# Patient Record
Sex: Male | Born: 1970 | Race: White | Hispanic: No | Marital: Married | Smoking: Never smoker
Health system: Southern US, Community
[De-identification: ages and names within clinical notes are randomized; demographics above are authoritative.]

## PROBLEM LIST (undated history)

## (undated) DIAGNOSIS — J45909 Unspecified asthma, uncomplicated: Secondary | ICD-10-CM

## (undated) DIAGNOSIS — M1990 Unspecified osteoarthritis, unspecified site: Secondary | ICD-10-CM

## (undated) DIAGNOSIS — T7840XA Allergy, unspecified, initial encounter: Secondary | ICD-10-CM

## (undated) HISTORY — PX: ROTATOR CUFF REPAIR: SHX139

## (undated) HISTORY — DX: Allergy, unspecified, initial encounter: T78.40XA

## (undated) HISTORY — DX: Unspecified osteoarthritis, unspecified site: M19.90

## (undated) HISTORY — DX: Unspecified asthma, uncomplicated: J45.909

## (undated) HISTORY — PX: OTHER SURGICAL HISTORY: SHX169

---

## 2020-08-08 ENCOUNTER — Ambulatory Visit: Payer: Managed Care, Other (non HMO) | Admitting: Internal Medicine

## 2020-08-08 ENCOUNTER — Encounter: Payer: Self-pay | Admitting: Internal Medicine

## 2020-08-08 ENCOUNTER — Ambulatory Visit (INDEPENDENT_AMBULATORY_CARE_PROVIDER_SITE_OTHER): Payer: Managed Care, Other (non HMO)

## 2020-08-08 ENCOUNTER — Other Ambulatory Visit: Payer: Self-pay

## 2020-08-08 VITALS — BP 130/80 | HR 46 | Temp 98.3°F | Ht 68.0 in | Wt 164.0 lb

## 2020-08-08 DIAGNOSIS — R102 Pelvic and perineal pain: Secondary | ICD-10-CM | POA: Diagnosis not present

## 2020-08-08 DIAGNOSIS — Z0000 Encounter for general adult medical examination without abnormal findings: Secondary | ICD-10-CM

## 2020-08-08 DIAGNOSIS — Z1211 Encounter for screening for malignant neoplasm of colon: Secondary | ICD-10-CM

## 2020-08-08 LAB — COMPREHENSIVE METABOLIC PANEL
ALT: 23 U/L (ref 0–53)
AST: 20 U/L (ref 0–37)
Albumin: 4.7 g/dL (ref 3.5–5.2)
Alkaline Phosphatase: 58 U/L (ref 39–117)
BUN: 17 mg/dL (ref 6–23)
CO2: 30 mEq/L (ref 19–32)
Calcium: 10 mg/dL (ref 8.4–10.5)
Chloride: 102 mEq/L (ref 96–112)
Creatinine, Ser: 0.97 mg/dL (ref 0.40–1.50)
GFR: 90.81 mL/min (ref >60.00)
Glucose, Bld: 89 mg/dL (ref 70–99)
Potassium: 4.5 mEq/L (ref 3.5–5.1)
Sodium: 140 mEq/L (ref 135–145)
Total Bilirubin: 0.6 mg/dL (ref 0.2–1.2)
Total Protein: 7 g/dL (ref 6.0–8.3)

## 2020-08-08 LAB — LIPID PANEL
Cholesterol: 188 mg/dL (ref 0–200)
HDL: 45.2 mg/dL (ref >39.00)
LDL Cholesterol: 114 mg/dL — ABNORMAL HIGH (ref 0–99)
NonHDL: 143.12
Total CHOL/HDL Ratio: 4
Triglycerides: 147 mg/dL (ref 0.0–149.0)
VLDL: 29.4 mg/dL (ref 0.0–40.0)

## 2020-08-08 LAB — CBC
HCT: 43.7 % (ref 39.0–52.0)
Hemoglobin: 15 g/dL (ref 13.0–17.0)
MCHC: 34.2 g/dL (ref 30.0–36.0)
MCV: 86.9 fl (ref 78.0–100.0)
Platelets: 246 10*3/uL (ref 150.0–400.0)
RBC: 5.03 Mil/uL (ref 4.22–5.81)
RDW: 13.1 % (ref 11.5–15.5)
WBC: 6.2 10*3/uL (ref 4.0–10.5)

## 2020-08-08 LAB — PSA: PSA: 0.31 ng/mL (ref 0.10–4.00)

## 2020-08-08 LAB — TSH: TSH: 2.27 u[IU]/mL (ref 0.35–4.50)

## 2020-08-08 LAB — VITAMIN B12: Vitamin B-12: 265 pg/mL (ref 211–911)

## 2020-08-08 LAB — VITAMIN D 25 HYDROXY (VIT D DEFICIENCY, FRACTURES): VITD: 28.26 ng/mL — ABNORMAL LOW (ref 30.00–100.00)

## 2020-08-08 NOTE — Progress Notes (Signed)
Subjective:   I, Daniel Hobbs, LAT, ATC acting as a scribe for Lynne Leader, MD.  I'm seeing this patient as a consultation for Dr. Pricilla Holm. Note will be routed back to referring provider/PCP.  CC: Pelvis pain  HPI: Pt is a 50yo male c/o pain in pelvis that's been ongoing for since March 2021. MOI: running overuse, running 50-60 miles a week, which is is usual training volume. Pt locates pain to super deep into the public symphysis and L-side of the rectum. Pt is an ultra marathon runner. PMHx osteitis pubis 2018. Now, pt is running x2 week and biking.  LBPn: yes- L4-5 herniated disc surgery; R-sided pain LE numbness/tingling: 2-weeks ago after a 40 mile bike ride; R-sided LE weakness: no Aggravates: sleeping Rx tried: PT   Dx imaging: 08/08/20 Bilat pelvis & L-spine XR  Past medical history, Surgical history, Family history, Social history, Allergies, and medications have been entered into the medical record, reviewed.   Review of Systems: No new headache, visual changes, nausea, vomiting, diarrhea, constipation, dizziness, abdominal pain, skin rash, fevers, chills, night sweats, weight loss, swollen lymph nodes, body aches, joint swelling, muscle aches, chest pain, shortness of breath, mood changes, visual or auditory hallucinations.   Objective:    Vitals:   08/09/20 1248  BP: 90/64  Pulse: (!) 42  SpO2: 99%   General: Well Developed, well nourished, and in no acute distress.  Neuro/Psych: Alert and oriented x3, extra-ocular muscles intact, able to move all 4 extremities, sensation grossly intact. Skin: Warm and dry, no rashes noted.  Respiratory: Not using accessory muscles, speaking in full sentences, trachea midline.  Cardiovascular: Pulses palpable, no extremity edema. Abdomen: Does not appear distended. MSK: L-spine normal. Nontender midline. Tender palpation lumbar paraspinal musculature. Decreased lumbar motion. Hips bilaterally normal.  Normal hip  motion. Mildly tender palpation pubic symphysis. Not particular tender palpation at ischial tuberosity or perianal areas. Normal hip and leg motion and strength.  Lab and Radiology Results DG Lumbar Spine Complete  Result Date: 08/08/2020 CLINICAL DATA:  Chronic lower back pain. EXAM: LUMBAR SPINE - COMPLETE 4+ VIEW COMPARISON:  None. FINDINGS: No fracture or spondylolisthesis is noted. Mild degenerative disc disease is noted at L2-3 and L3-4. IMPRESSION: Mild multilevel degenerative disc disease. No acute abnormality is noted. Electronically Signed   By: Marijo Conception M.D.   On: 08/08/2020 09:51   DG HIPS BILAT WITH PELVIS 3-4 VIEWS  Result Date: 08/08/2020 CLINICAL DATA:  Chronic hip pain. EXAM: DG HIP (WITH OR WITHOUT PELVIS) 3-4V BILAT COMPARISON:  None. FINDINGS: There is no evidence of hip fracture or dislocation. There is no evidence of arthropathy or other focal bone abnormality. IMPRESSION: Negative. Electronically Signed   By: Marijo Conception M.D.   On: 08/08/2020 09:53  I, Lynne Leader, personally (independently) visualized and performed the interpretation of the images attached in this note.   Impression and Recommendations:    Assessment and Plan: 50 y.o. male with chronic pelvic pain.  Multifactorial very likely.  Patient has 2 discrete pain locations.  He has some pain near the pubic symphysis that very likely is related to his prior history of osteitis pubis.  He however also has left-sided deep pelvis pain near the perianal area.  This pain started somewhat acutely during a run months ago and has not resolved with good physical therapy trial.  Concern for past 2 or 3 sacral radiculopathy.  Also could be more discrete muscle or tendon injury.  Plan for  MRI of the pelvis.  If nondiagnostic consider MRI lumbar spine to look for radiculopathy.  Recheck after MRI.  Consider ultimately may need dedicated pelvic PT.  PDMP not reviewed this encounter. Orders Placed This Encounter   Procedures  . MR PELVIS WO CONTRAST    8 months chronic pelvis pain in pubic area and into area near rectum left side.  Eval tendon or muscle injury vs sacral radiculopathy.    Standing Status:   Future    Standing Expiration Date:   08/09/2021    Order Specific Question:   What is the patient's sedation requirement?    Answer:   No Sedation    Order Specific Question:   Does the patient have a pacemaker or implanted devices?    Answer:   No    Order Specific Question:   Preferred imaging location?    Answer:   Product/process development scientist (table limit-350lbs)   No orders of the defined types were placed in this encounter.   Discussed warning signs or symptoms. Please see discharge instructions. Patient expresses understanding.   The above documentation has been reviewed and is accurate and complete Lynne Leader, M.D.

## 2020-08-08 NOTE — Patient Instructions (Addendum)
We will check the x-ray today for the pelvis.   We will get you in for the colon cancer screening.  We will have you schedule with sports medicine to get the pain relieved in the pelvis and you back to running.    Health Maintenance, Male Adopting a healthy lifestyle and getting preventive care are important in promoting health and wellness. Ask your health care provider about:  The right schedule for you to have regular tests and exams.  Things you can do on your own to prevent diseases and keep yourself healthy. What should I know about diet, weight, and exercise? Eat a healthy diet  Eat a diet that includes plenty of vegetables, fruits, low-fat dairy products, and lean protein.  Do not eat a lot of foods that are high in solid fats, added sugars, or sodium.   Maintain a healthy weight Body mass index (BMI) is a measurement that can be used to identify possible weight problems. It estimates body fat based on height and weight. Your health care provider can help determine your BMI and help you achieve or maintain a healthy weight. Get regular exercise Get regular exercise. This is one of the most important things you can do for your health. Most adults should:  Exercise for at least 150 minutes each week. The exercise should increase your heart rate and make you sweat (moderate-intensity exercise).  Do strengthening exercises at least twice a week. This is in addition to the moderate-intensity exercise.  Spend less time sitting. Even light physical activity can be beneficial. Watch cholesterol and blood lipids Have your blood tested for lipids and cholesterol at 51 years of age, then have this test every 5 years. You may need to have your cholesterol levels checked more often if:  Your lipid or cholesterol levels are high.  You are older than 51 years of age.  You are at high risk for heart disease. What should I know about cancer screening? Many types of cancers can be  detected early and may often be prevented. Depending on your health history and family history, you may need to have cancer screening at various ages. This may include screening for:  Colorectal cancer.  Prostate cancer.  Skin cancer.  Lung cancer. What should I know about heart disease, diabetes, and high blood pressure? Blood pressure and heart disease  High blood pressure causes heart disease and increases the risk of stroke. This is more likely to develop in people who have high blood pressure readings, are of African descent, or are overweight.  Talk with your health care provider about your target blood pressure readings.  Have your blood pressure checked: ? Every 3-5 years if you are 58-65 years of age. ? Every year if you are 51 years old or older.  If you are between the ages of 33 and 62 and are a current or former smoker, ask your health care provider if you should have a one-time screening for abdominal aortic aneurysm (AAA). Diabetes Have regular diabetes screenings. This checks your fasting blood sugar level. Have the screening done:  Once every three years after age 37 if you are at a normal weight and have a low risk for diabetes.  More often and at a younger age if you are overweight or have a high risk for diabetes. What should I know about preventing infection? Hepatitis B If you have a higher risk for hepatitis B, you should be screened for this virus. Talk with your health care  provider to find out if you are at risk for hepatitis B infection. Hepatitis C Blood testing is recommended for:  Everyone born from [birth year] through 1966.  Anyone with known risk factors for hepatitis C. Sexually transmitted infections (STIs)  You should be screened each year for STIs, including gonorrhea and chlamydia, if: ? You are sexually active and are younger than 51 years of age. ? You are older than 52 years of age and your health care provider tells you that you are at risk  for this type of infection. ? Your sexual activity has changed since you were last screened, and you are at increased risk for chlamydia or gonorrhea. Ask your health care provider if you are at risk.  Ask your health care provider about whether you are at high risk for HIV. Your health care provider may recommend a prescription medicine to help prevent HIV infection. If you choose to take medicine to prevent HIV, you should first get tested for HIV. You should then be tested every 3 months for as long as you are taking the medicine. Follow these instructions at home: Lifestyle  Do not use any products that contain nicotine or tobacco, such as cigarettes, e-cigarettes, and chewing tobacco. If you need help quitting, ask your health care provider.  Do not use street drugs.  Do not share needles.  Ask your health care provider for help if you need support or information about quitting drugs. Alcohol use  Do not drink alcohol if your health care provider tells you not to drink.  If you drink alcohol: ? Limit how much you have to 0-2 drinks a day. ? Be aware of how much alcohol is in your drink. In the [place] one drink equals one 12 oz bottle of beer (355 mL), one 5 oz glass of wine (148 mL), or one 1 oz glass of hard liquor (44 mL). General instructions  Schedule regular health, dental, and eye exams.  Stay current with your vaccines.  Tell your health care provider if: ? You often feel depressed. ? You have ever been abused or do not feel safe at home. Summary  Adopting a healthy lifestyle and getting preventive care are important in promoting health and wellness.  Follow your health care provider's instructions about healthy diet, exercising, and getting tested or screened for diseases.  Follow your health care provider's instructions on monitoring your cholesterol and blood pressure. This information is not intended to replace advice given to you by your health care provider.  Make sure you discuss any questions you have with your health care provider. Document Revised: 07/08/2018 Document Reviewed: 07/08/2018 Elsevier Patient Education  2021 [employer].

## 2020-08-08 NOTE — Progress Notes (Signed)
   Subjective:   Patient ID: Daniel Hobbs, male    DOB: Jun 28, 1971, 50 y.o.   MRN: [id number]  HPI The patient is a new 50 YO man coming in for pelvis pain (going on for several months, is a long distance runner, thought it was a muscle injury and it has not healed as normal, prior low back problems, denies numbness in the low back or legs) and also overall health.   PMH, [facility], social history reviewed and updated  Review of Systems  Constitutional: Negative.   HENT: Negative.   Eyes: Negative.   Respiratory: Negative for cough, chest tightness and shortness of breath.   Cardiovascular: Negative for chest pain, palpitations and leg swelling.  Gastrointestinal: Negative for abdominal distention, abdominal pain, constipation, diarrhea, nausea and vomiting.  Musculoskeletal: Positive for arthralgias and myalgias.  Skin: Negative.   Neurological: Negative.   Psychiatric/Behavioral: Negative.     Objective:  Physical Exam Constitutional:      Appearance: He is well-developed and well-nourished.  HENT:     Head: Normocephalic and atraumatic.  Eyes:     Extraocular Movements: EOM normal.  Cardiovascular:     Rate and Rhythm: Normal rate and regular rhythm.  Pulmonary:     Effort: Pulmonary effort is normal. No respiratory distress.     Breath sounds: Normal breath sounds. No wheezing or rales.  Abdominal:     General: Bowel sounds are normal. There is no distension.     Palpations: Abdomen is soft.     Tenderness: There is no abdominal tenderness. There is no rebound.  Musculoskeletal:        General: No edema.     Cervical back: Normal range of motion.  Skin:    General: Skin is warm and dry.  Neurological:     Mental Status: He is alert and oriented to person, place, and time.     Coordination: Coordination normal.  Psychiatric:        Mood and Affect: Mood and affect normal.     Vitals:   08/08/20 0842  BP: 130/80  Pulse: (!) 46  Temp: 98.3 F (36.8 C)  TempSrc:  Oral  SpO2: 99%  Weight: 164 lb (74.4 kg)  Height: 5\' 8"  (1.727 m)    This visit occurred during the SARS-CoV-2 public health emergency.  Safety protocols were in place, including screening questions prior to the visit, additional usage of staff PPE, and extensive cleaning of exam room while observing appropriate contact time as indicated for disinfecting solutions.   Assessment & Plan:

## 2020-08-09 ENCOUNTER — Ambulatory Visit: Payer: Managed Care, Other (non HMO) | Admitting: Family Medicine

## 2020-08-09 ENCOUNTER — Other Ambulatory Visit: Payer: Self-pay

## 2020-08-09 ENCOUNTER — Encounter: Payer: Self-pay | Admitting: Family Medicine

## 2020-08-09 VITALS — BP 90/64 | HR 42 | Ht 68.0 in | Wt 167.0 lb

## 2020-08-09 DIAGNOSIS — R102 Pelvic and perineal pain: Secondary | ICD-10-CM

## 2020-08-09 DIAGNOSIS — N5082 Scrotal pain: Secondary | ICD-10-CM | POA: Diagnosis not present

## 2020-08-09 NOTE — Patient Instructions (Addendum)
Thank you for coming in today.  Plan for MRI.   Recheck following MRI.

## 2020-08-11 DIAGNOSIS — R102 Pelvic and perineal pain: Secondary | ICD-10-CM | POA: Insufficient documentation

## 2020-08-11 DIAGNOSIS — Z0000 Encounter for general adult medical examination without abnormal findings: Secondary | ICD-10-CM | POA: Insufficient documentation

## 2020-08-11 NOTE — Assessment & Plan Note (Signed)
Checking x-ray pelvis and lumbar and will schedule with sports medicine.

## 2020-08-11 NOTE — Assessment & Plan Note (Signed)
Flu shot declines. Covid-19 up to date including booster. Shingrix counseled. Tetanus declines. Colonoscopy referral to GI. Counseled about sun safety and mole surveillance. Counseled about the dangers of distracted driving. Given 10 year screening recommendations.

## 2020-08-14 ENCOUNTER — Ambulatory Visit: Payer: Self-pay | Admitting: Family Medicine

## 2020-08-28 ENCOUNTER — Telehealth: Payer: Self-pay

## 2020-08-28 NOTE — Telephone Encounter (Signed)
Patient called stating he had not heard form radiologist to schedule his MRI. I looked in the chart and it was denied since patient had not had 3 month trial of conservative therapy and a follow up. I had placed the denial notice in the basket/box up front to look over. Patient would like a call to go over what we should/need to do from here.

## 2020-08-28 NOTE — Telephone Encounter (Signed)
See pt call info.

## 2020-08-29 ENCOUNTER — Other Ambulatory Visit: Payer: Self-pay

## 2020-08-29 ENCOUNTER — Telehealth: Payer: Self-pay | Admitting: Family Medicine

## 2020-08-29 ENCOUNTER — Encounter: Payer: Self-pay | Admitting: Internal Medicine

## 2020-08-29 ENCOUNTER — Ambulatory Visit: Payer: Managed Care, Other (non HMO) | Admitting: Internal Medicine

## 2020-08-29 DIAGNOSIS — M545 Low back pain, unspecified: Secondary | ICD-10-CM

## 2020-08-29 DIAGNOSIS — R102 Pelvic and perineal pain: Secondary | ICD-10-CM

## 2020-08-29 DIAGNOSIS — M79641 Pain in right hand: Secondary | ICD-10-CM | POA: Diagnosis not present

## 2020-08-29 MED ORDER — PREDNISONE 20 MG PO TABS: 40.0000 mg | ORAL_TABLET | Freq: Every day | ORAL | 0 refills | Status: DC

## 2020-08-29 NOTE — Progress Notes (Signed)
   Subjective:   Patient ID: Daniel Hobbs, male    DOB: Apr 21, 1971, 50 y.o.   MRN: [id number]  HPI The patient is a 50 YO man coming in for concerns about right hand pain. Started out of the blue in the last several weeks. No overuse or injury. Would like to try prednisone pack for this. Is taking otc nsaids with minimal relief. Denies swelling in the hand or wrist. Pain at the base of the fingers.   Review of Systems  Constitutional: Negative.   HENT: Negative.   Eyes: Negative.   Respiratory: Negative for cough, chest tightness and shortness of breath.   Cardiovascular: Negative for chest pain, palpitations and leg swelling.  Gastrointestinal: Negative for abdominal distention, abdominal pain, constipation, diarrhea, nausea and vomiting.  Musculoskeletal: Positive for myalgias.  Skin: Negative.   Neurological: Negative.   Psychiatric/Behavioral: Negative.     Objective:  Physical Exam Constitutional:      Appearance: He is well-developed and well-nourished.  HENT:     Head: Normocephalic and atraumatic.  Eyes:     Extraocular Movements: EOM normal.  Cardiovascular:     Rate and Rhythm: Normal rate and regular rhythm.  Pulmonary:     Effort: Pulmonary effort is normal. No respiratory distress.     Breath sounds: Normal breath sounds. No wheezing or rales.  Abdominal:     General: Bowel sounds are normal. There is no distension.     Palpations: Abdomen is soft.     Tenderness: There is no abdominal tenderness. There is no rebound.  Musculoskeletal:        General: Tenderness present. No edema.     Cervical back: Normal range of motion.     Comments: Tenderness mcp joints right 2-4th fingers, no swelling or effusion detected on exam  Skin:    General: Skin is warm and dry.  Neurological:     Mental Status: He is alert and oriented to person, place, and time.     Coordination: Coordination normal.  Psychiatric:        Mood and Affect: Mood and affect normal.      Vitals:   08/29/20 1338  BP: 118/70  Pulse: (!) 55  Resp: 18  Temp: 97.9 F (36.6 C)  TempSrc: Oral  SpO2: 99%  Weight: 168 lb 9.6 oz (76.5 kg)  Height: 5\' 8"  (1.727 m)    This visit occurred during the SARS-CoV-2 public health emergency.  Safety protocols were in place, including screening questions prior to the visit, additional usage of staff PPE, and extensive cleaning of exam room while observing appropriate contact time as indicated for disinfecting solutions.   Assessment & Plan:

## 2020-08-29 NOTE — Patient Instructions (Signed)
We have sent in the prednisone to take 2 pills daily for 5 days.   
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
## 2020-08-29 NOTE — Telephone Encounter (Signed)
I called back and left a message about the MRI. I recommended waiting 6 weeks from 08/09/19

## 2020-08-30 DIAGNOSIS — M79641 Pain in right hand: Secondary | ICD-10-CM | POA: Insufficient documentation

## 2020-08-30 NOTE — Assessment & Plan Note (Signed)
Rx prednisone burst to see if this helps. If not encouraged him to bring up when returning to sports medicine.

## 2020-10-18 NOTE — Telephone Encounter (Signed)
Patient called back.  It has been the reccommended 6 weeks and he asked if the MRI could be re-ordered for him?  Please advise.

## 2020-10-20 NOTE — Telephone Encounter (Signed)
I have re run this through insurance. Awaiting reply from insurance

## 2020-10-20 NOTE — Telephone Encounter (Signed)
Please reattempt authorization of the MRI.  It has now been long enough.  MRI order should still be valid.  Please contact patient as well.

## 2020-10-25 NOTE — Telephone Encounter (Signed)
Called patient informed of approval of MRI will call and schedule with [facility] Daniel Hobbs

## 2020-10-29 ENCOUNTER — Ambulatory Visit (INDEPENDENT_AMBULATORY_CARE_PROVIDER_SITE_OTHER): Payer: Managed Care, Other (non HMO)

## 2020-10-29 ENCOUNTER — Other Ambulatory Visit: Payer: Self-pay

## 2020-10-29 DIAGNOSIS — N5082 Scrotal pain: Secondary | ICD-10-CM

## 2020-10-29 DIAGNOSIS — R1030 Lower abdominal pain, unspecified: Secondary | ICD-10-CM

## 2020-11-01 ENCOUNTER — Other Ambulatory Visit: Payer: Self-pay

## 2020-11-01 ENCOUNTER — Ambulatory Visit (AMBULATORY_SURGERY_CENTER): Payer: Self-pay

## 2020-11-01 VITALS — Ht 68.0 in | Wt 164.8 lb

## 2020-11-01 DIAGNOSIS — Z1211 Encounter for screening for malignant neoplasm of colon: Secondary | ICD-10-CM

## 2020-11-01 MED ORDER — NA SULFATE-K SULFATE-MG SULF 17.5-3.13-1.6 GM/177ML PO SOLN: 1.0000 | Freq: Once | ORAL | 0 refills | Status: AC

## 2020-11-01 NOTE — Progress Notes (Signed)
MRI pelvis shows osteitis pubis.  This probably does explain your pain.  Recommend return to clinic with me in the near future to go over the results and discuss treatment plan and options.

## 2020-11-01 NOTE — Progress Notes (Signed)
Denies allergies to eggs or soy products. Denies complication of anesthesia or sedation. Denies use of weight loss medication. Denies use of O2.   Emmi instructions given for colonoscopy.  

## 2020-11-02 NOTE — Progress Notes (Signed)
Daniel Hobbs, am serving as a Education administrator for Dr. Lynne Hobbs.  Daniel Hobbs is a 50 y.o. male who presents to [place] at [facility] today for f/u of pelvic pain and pelvis MRI review.  He was last seen by Dr. Georgina Snell on 08/09/20 w/ pain since March 2021 associated w/ long distance runing.  He is an ultra marathon runner.  Since his last visit, pt reports that he is still feeling the same.  He has not been running at all over the last few months and notes that he still has a little bit of pelvis pain but overall is improving.  Pain is located at the pubic symphysis and just inferior to the left side of the pubic symphysis and hip adductor insertion  He additionally notes some right low back pain.  He notes that he has a history of back surgery.  The physical therapy that he had for his pelvis did not address his back pain at all.  Diagnostic imaging: Pelvis MRI- 10/29/20; B hip and L-spine XR- 08/08/20  Pertinent review of systems: No fevers or chills  Relevant historical information: History of lumbar fusion   Exam:  BP 110/74 (BP Location: Left Arm, Patient Position: Sitting, Cuff Size: Normal)   Pulse (!) 40   Ht 5\' 8"  (1.727 m)   Wt 166 lb (75.3 kg)   SpO2 99%   BMI 25.24 kg/m  General: Well Developed, well nourished, and in no acute distress.   MSK: Pelvis: Tender palpation pubic symphysis    Lab and Radiology Results DG Lumbar Spine Complete  Result Date: 08/08/2020 CLINICAL DATA:  Chronic lower back pain. EXAM: LUMBAR SPINE - COMPLETE 4+ VIEW COMPARISON:  None. FINDINGS: No fracture or spondylolisthesis is noted. Mild degenerative disc disease is noted at L2-3 and L3-4. IMPRESSION: Mild multilevel degenerative disc disease. No acute abnormality is noted. Electronically Signed   By: Marijo Conception M.D.   On: 08/08/2020 09:51   MR PELVIS WO CONTRAST  Result Date: 10/31/2020 CLINICAL DATA:  Chronic pubic/groin pain EXAM: MRI PELVIS WITHOUT CONTRAST  TECHNIQUE: Multiplanar multisequence MR imaging of the pelvis was performed. No intravenous contrast was administered. COMPARISON:  Radiographs 08/08/2020 FINDINGS: Osseous structures: Edema signal in the pubic bodies and adjacent superior pubic rami with suspected subchondral bony resorption along the left pubic body on image 25 series 5 and image 25 series 6. Low-level edema in the pubic symphysis is observed. No definite fluid-filled cleft between the adductor aponeurosis and the pubis is identified although there is likely some periostitis along the abductor aponeurosis attachment to the pubis for example on images 18 through 27 of series 10. No other significant marrow edema is identified in the bony pelvis or hips. Regional joints: No hip effusion. Preserved articular cartilage in the hips. No SI joint effusion. Bursa: No regional bursitis. Musculotendinous: Intact hamstring tendons. No significant regional muscular edema. Other: Small left scrotal hydrocele. IMPRESSION: 1. Osteitis pubis. Although there is no fluid-filled gap between the pubis and the adductor aponeurosis, there is likely some low-grade periostitis in the pubis bilaterally including along the attachment to the abductor aponeurosis for example as shown on image 27 series 10 on the right and image 20 series 10 on the left. Electronically Signed   By: Van Clines M.D.   On: 10/31/2020 11:00   DG HIPS BILAT WITH PELVIS 3-4 VIEWS  Result Date: 08/08/2020 CLINICAL DATA:  Chronic hip pain. EXAM: DG HIP (WITH OR WITHOUT PELVIS) 3-4V  BILAT COMPARISON:  None. FINDINGS: There is no evidence of hip fracture or dislocation. There is no evidence of arthropathy or other focal bone abnormality. IMPRESSION: Negative. Electronically Signed   By: Marijo Conception M.D.   On: 08/08/2020 09:53   I, Daniel Hobbs, personally (independently) visualized and performed the interpretation of the images attached in this note.  Procedure: Real-time Ultrasound  Guided Injection of pubic symphysis Device: Philips Affiniti 50G Images permanently stored and available for review in PACS Verbal informed consent obtained.  Discussed risks and benefits of procedure. Warned about infection bleeding damage to structures skin hypopigmentation and fat atrophy among others. Patient expresses understanding and agreement Time-out conducted.   Noted no overlying erythema, induration, or other signs of local infection.   Skin prepped in a sterile fashion.   Local anesthesia: Topical Ethyl chloride.   With sterile technique and under real time ultrasound guidance:  40 mg of Kenalog and 2 mL of Marcaine injected into pubic symphysis. Fluid seen entering the pubic symphysis.   Completed without difficulty   Pain immediately resolved at pubic symphysis and pain inferior to pubic symphysis left side.  Low back pain did not resolve.  This suggests accurate placement of the medication and that pain generated at the pubic symphysis is responsible for both the pubic symphysis pain and inferior pain.  Advised to call if fevers/chills, erythema, induration, drainage, or persistent bleeding.   Images permanently stored and available for review in the ultrasound unit.  Impression: Technically successful ultrasound guided injection.        Assessment and Plan: 50 y.o. 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  Patient has failed conservative management with physical therapy and time.  After careful discussion with patient including discussion of risks and benefits proceeded with diagnostic and therapeutic injection of the pubic symphysis in clinic.  Patient had immediate pain relief in clinic indicating that the osteitis pubis is probably the main pain generator.  Hopefully will have good long-lasting benefit from the steroid component.  Plan to reassess in 1 month.   If the groin pain has been resolved at that point could consider focus more in the back at that time.   PDMP not reviewed this encounter. Orders Placed This Encounter  Procedures  . [place] LIMITED JOINT SPACE STRUCTURES LOW LEFT(NO LINKED CHARGES)    Standing Status:   Future    Number of Occurrences:   1    Standing Expiration Date:   11/03/2021    Order Specific Question:   Reason for Exam (SYMPTOM  OR DIAGNOSIS REQUIRED)    Answer:   Pelvic pain    Order Specific Question:   Preferred imaging location?    Answer:   Internal   No orders of the defined types were placed in this encounter.    Discussed warning signs or symptoms. Please see discharge instructions. Patient expresses understanding.   The above documentation has been reviewed and is accurate and complete Daniel Hobbs, M.D.

## 2020-11-03 ENCOUNTER — Ambulatory Visit: Payer: Self-pay

## 2020-11-03 ENCOUNTER — Ambulatory Visit: Payer: Managed Care, Other (non HMO) | Admitting: Family Medicine

## 2020-11-03 ENCOUNTER — Other Ambulatory Visit: Payer: Self-pay

## 2020-11-03 ENCOUNTER — Encounter: Payer: Self-pay | Admitting: Family Medicine

## 2020-11-03 VITALS — BP 110/74 | HR 40 | Ht 68.0 in | Wt 166.0 lb

## 2020-11-03 DIAGNOSIS — M869 Osteomyelitis, unspecified: Secondary | ICD-10-CM | POA: Diagnosis not present

## 2020-11-03 DIAGNOSIS — R102 Pelvic and perineal pain: Secondary | ICD-10-CM

## 2020-11-03 NOTE — Patient Instructions (Addendum)
Good to see you   See me again in 4 weeks.   See how it feels while numb today (4-6 hours) Try to do some things that normally would hurt while numb.   See how you feel in the next few weeks.   Call or go to the ER if you develop a large red swollen joint with extreme pain or oozing puss.

## 2020-11-13 ENCOUNTER — Encounter: Payer: Self-pay | Admitting: Gastroenterology

## 2020-11-15 ENCOUNTER — Encounter: Payer: Self-pay | Admitting: Gastroenterology

## 2020-11-15 ENCOUNTER — Other Ambulatory Visit: Payer: Self-pay

## 2020-11-15 ENCOUNTER — Ambulatory Visit (AMBULATORY_SURGERY_CENTER): Payer: Managed Care, Other (non HMO) | Admitting: Gastroenterology

## 2020-11-15 VITALS — BP 112/67 | HR 35 | Temp 97.2°F | Resp 34 | Ht 68.0 in | Wt 164.8 lb

## 2020-11-15 DIAGNOSIS — Z8371 Family history of colonic polyps: Secondary | ICD-10-CM | POA: Diagnosis not present

## 2020-11-15 DIAGNOSIS — K621 Rectal polyp: Secondary | ICD-10-CM

## 2020-11-15 DIAGNOSIS — D128 Benign neoplasm of rectum: Secondary | ICD-10-CM

## 2020-11-15 DIAGNOSIS — Z1211 Encounter for screening for malignant neoplasm of colon: Secondary | ICD-10-CM | POA: Diagnosis present

## 2020-11-15 MED ORDER — SODIUM CHLORIDE 0.9 % IV SOLN
500.0000 mL | Freq: Once | INTRAVENOUS | Status: DC
Start: 2020-11-15 — End: 2020-11-15

## 2020-11-15 NOTE — Op Note (Addendum)
[place] Patient Name: Daniel Hobbs Procedure Date: 11/15/2020 11:28 AM MRN: [id number] Endoscopist: Thornton Park MD, MD Age: 50 Referring MD:  Date of Birth: [birth year] Gender: Male Account #: [id number] Procedure:                Colonoscopy Indications:              Screening for colorectal malignant neoplasm, This                            is the patient's first colonoscopy                           Father and sister with colon polyps                           No known family history of colon cancer Medicines:                Monitored Anesthesia Care Procedure:                Pre-Anesthesia Assessment:                           - Prior to the procedure, a History and Physical                            was performed, and patient medications and                            allergies were reviewed. The patient's tolerance of                            previous anesthesia was also reviewed. The risks                            and benefits of the procedure and the sedation                            options and risks were discussed with the patient.                            All questions were answered, and informed consent                            was obtained. Prior Anticoagulants: The patient has                            taken no previous anticoagulant or antiplatelet                            agents. ASA Grade Assessment: II - A patient with                            mild systemic disease. After reviewing the risks  and benefits, the patient was deemed in                            satisfactory condition to undergo the procedure.                           After obtaining informed consent, the colonoscope                            was passed under direct vision. Throughout the                            procedure, the patient's blood pressure, pulse, and                            oxygen saturations were monitored continuously.  The                            Olympus CF-HQ190L [id number]) Colonoscope was                            introduced through the anus and advanced to the 2                            cm into the ileum. A second forward view of the                            right colon was performed. The colonoscopy was                            performed with moderate difficulty due to repeated                            clogging of the scope with residual seeds in the                            colon. The patient tolerated the procedure well.                            The quality of the bowel preparation was good. The                            terminal ileum, ileocecal valve, appendiceal                            orifice, and rectum were photographed. Scope In: 11:41:41 AM Scope Out: 11:59:36 AM Scope Withdrawal Time: 0 hours 14 minutes 39 seconds  Total Procedure Duration: 0 hours 17 minutes 55 seconds  Findings:                 The perianal and digital rectal examinations were                            normal.  Two sessile polyps were found in the rectum. The                            polyps were less than 1 mm in size. These polyps                            were removed with a cold biopsy forceps. Resection                            and retrieval were complete. Estimated blood loss                            was minimal.                           The exam was otherwise without abnormality on                            direct and retroflexion views except for internal                            hemorrhoids. Complications:            No immediate complications. Estimated blood loss:                            Minimal. Estimated Blood Loss:     Estimated blood loss was minimal. Impression:               - Two less than 1 mm polyps in the rectum, removed                            with a cold biopsy forceps. Resected and retrieved.                           - Internal  hemorrhoids.                           - The examination was otherwise normal on direct                            and retroflexion views. Recommendation:           - Patient has a contact number available for                            emergencies. The signs and symptoms of potential                            delayed complications were discussed with the                            patient. Return to normal activities tomorrow.                            Written discharge  instructions were provided to the                            patient.                           - Resume previous diet.                           - Continue present medications.                           - Await pathology results.                           - Repeat colonoscopy date to be determined after                            pending pathology results are reviewed for                            surveillance. Surveillance should not be longer                            than 5 years given the family history.                           - Plan addition prep prior to next colonoscopy to                            minimize residual seeds in the colon.                           - Emerging evidence supports eating a diet of                            fruits, vegetables, grains, calcium, and yogurt                            while reducing red meat and alcohol may reduce the                            risk of colon cancer.                           - Thank you for allowing me to be involved in your                            colon cancer prevention. Thornton Park MD, MD 11/15/2020 12:04:43 PM This report has been signed electronically.

## 2020-11-15 NOTE — Progress Notes (Signed)
A and O x3. Report to RN. Tolerated MAC anesthesia well.

## 2020-11-15 NOTE — Progress Notes (Signed)
VS by CW  I have reviewed the patient's medical history in detail and updated the computerized patient record.'  Pt HR 36. Pt states that's his baseline r/t being a runner. CRNA made aware.

## 2020-11-15 NOTE — Patient Instructions (Addendum)
HANDOUTS PROVIDED ON: POLYPS & HEMORRHOIDS  The polyps removed today have been sent for pathology.  The results can take 1-3 weeks to receive.  When your next colonoscopy should occur will be based on the pathology results.    You may resume your previous diet and medication schedule.  Thank you for allowing [place] to care for you today!!!   YOU HAD AN ENDOSCOPIC PROCEDURE TODAY AT [place]:   Refer to the procedure report that was given to you for any specific questions about what was found during the examination.  If the procedure report does not answer your questions, please call your gastroenterologist to clarify.  If you requested that your care partner not be given the details of your procedure findings, then the procedure report has been included in a sealed envelope for you to review at your convenience later.  YOU SHOULD EXPECT: Some feelings of bloating in the abdomen. Passage of more gas than usual.  Walking can help get rid of the air that was put into your GI tract during the procedure and reduce the bloating. If you had a lower endoscopy (such as a colonoscopy or flexible sigmoidoscopy) you may notice spotting of blood in your stool or on the toilet paper. If you underwent a bowel prep for your procedure, you may not have a normal bowel movement for a few days.  Please Note:  You might notice some irritation and congestion in your nose or some drainage.  This is from the oxygen used during your procedure.  There is no need for concern and it should clear up in a day or so.  SYMPTOMS TO REPORT IMMEDIATELY:   Following lower endoscopy (colonoscopy or flexible sigmoidoscopy):  Excessive amounts of blood in the stool  Significant tenderness or worsening of abdominal pains  Swelling of the abdomen that is new, acute  Fever of 100F or higher  For urgent or emergent issues, a gastroenterologist can be reached at any hour by calling [phone]. Do not use MyChart  messaging for urgent concerns.    DIET:  We do recommend a small meal at first, but then you may proceed to your regular diet.  Drink plenty of fluids but you should avoid alcoholic beverages for 24 hours.  ACTIVITY:  You should plan to take it easy for the rest of today and you should NOT DRIVE or use heavy machinery until tomorrow (because of the sedation medicines used during the test).    FOLLOW UP: Our staff will call the number listed on your records Friday morning between 7:15 am and 8:15 am following your procedure to check on you and address any questions or concerns that you may have regarding the information given to you following your procedure. If we do not reach you, we will leave a message.  We will attempt to reach you two times.  During this call, we will ask if you have developed any symptoms of COVID 19. If you develop any symptoms (ie: fever, flu-like symptoms, shortness of breath, cough etc.) before then, please call [phone].  If you test positive for Covid 19 in the 2 weeks post procedure, please call and report this information to [place].    If any biopsies were taken you will be contacted by phone or by letter within the next 1-3 weeks.  Please call us at [phone] if you have not heard about the biopsies in 3 weeks.    SIGNATURES/CONFIDENTIALITY: You and/or your care partner  have signed paperwork which will be entered into your electronic medical record.  These signatures attest to the fact that that the information above on your After Visit Summary has been reviewed and is understood.  Full responsibility of the confidentiality of this discharge information lies with you and/or your care-partner.

## 2020-11-15 NOTE — Progress Notes (Signed)
Called to room to assist during endoscopic procedure.  Patient ID and intended procedure confirmed with present staff. Received instructions for my participation in the procedure from the performing physician.  

## 2020-11-17 ENCOUNTER — Telehealth: Payer: Self-pay | Admitting: *Deleted

## 2020-11-17 NOTE — Telephone Encounter (Signed)
  Follow up Call-  Call back number 11/15/2020  Post procedure Call Back phone  # [phone]  Permission to leave phone message Yes     Patient questions:  Do you have a fever, pain , or abdominal swelling? No. Pain Score  0 *  Have you tolerated food without any problems? Yes.    Have you been able to return to your normal activities? Yes.    Do you have any questions about your discharge instructions: Diet   No. Medications  No. Follow up visit  No.  Do you have questions or concerns about your Care? No.  Actions: * If pain score is 4 or above: No action needed, pain <4.  1. Have you developed a fever since your procedure? no  2.   Have you had an respiratory symptoms (SOB or cough) since your procedure? no  3.   Have you tested positive for COVID 19 since your procedure no  4.   Have you had any family members/close contacts diagnosed with the COVID 19 since your procedure?  no   If yes to any of these questions please route to Joylene John, RN and Joella Prince, RN

## 2020-11-23 ENCOUNTER — Encounter: Payer: Self-pay | Admitting: Gastroenterology

## 2020-11-30 NOTE — Progress Notes (Signed)
   I, Wendy Poet, LAT, ATC, am serving as scribe for Dr. Lynne Leader.  Emmauel Hallums is a 50 y.o. male who presents to [place] at [facility] today for f/u of pelvic/pubic symphysis pain.  He was last seen by Dr. Georgina Snell on 11/03/20 to review his pelvic MRI and had a pubic symphysis injection.  Pt is an avid runner and competes in ultra marathons.  Since his last visit, pt reports much relief from the pubic symphysis steroid injection. Pt reports he has returned to running. Pt c/o continued low back pain. Pt contributes pain to anterior pelvic tilt. Pt has been doing back exercises and stretches at home.  Diagnostic testing: Pelvic MRI- 10/29/20; B hip and L-spine XR- 08/08/20   Pertinent review of systems: No fevers or chills  Relevant historical information: Osteitis pubis   Exam:  BP 130/80   Pulse (!) 41   Ht 5\' 8"  (1.727 m)   Wt 164 lb (74.4 kg)   SpO2 98%   BMI 24.94 kg/m  General: Well Developed, well nourished, and in no acute distress.   MSK: Spine normal. Nontender midline. Normal lumbar motion pain with flexion.  Lower extremity strength is intact  Lab and Radiology Results  EXAM: LUMBAR SPINE - COMPLETE 4+ VIEW  COMPARISON:  None.  FINDINGS: No fracture or spondylolisthesis is noted. Mild degenerative disc disease is noted at L2-3 and L3-4.  IMPRESSION: Mild multilevel degenerative disc disease. No acute abnormality is noted.   Electronically Signed   By: Marijo Conception M.D.   On: 08/08/2020 09:51 I, Lynne Leader, personally (independently) visualized and performed the interpretation of the images attached in this note.     Assessment and Plan: 50 y.o. male with low back pain.  This is a chronic issue but has been more pronounced or at least appreciable since his pelvic pain has been resolved with a steroid injection last month.  He is able to increase his running now that his pelvis is not hurting which also may be causing his  back to hurt a little worse.  Plan for physical therapy and recheck in 6 weeks.  If not improved consider MRI.   PDMP not reviewed this encounter. Orders Placed This Encounter  Procedures  . Ambulatory referral to Physical Therapy    Referral Priority:   Routine    Referral Type:   Physical Medicine    Referral Reason:   Specialty Services Required    Requested Specialty:   Physical Therapy    Number of Visits Requested:   1   No orders of the defined types were placed in this encounter.    Discussed warning signs or symptoms. Please see discharge instructions. Patient expresses understanding.   The above documentation has been reviewed and is accurate and complete Lynne Leader, M.D.

## 2020-12-01 ENCOUNTER — Other Ambulatory Visit: Payer: Self-pay

## 2020-12-01 ENCOUNTER — Ambulatory Visit: Payer: Managed Care, Other (non HMO) | Admitting: Family Medicine

## 2020-12-01 VITALS — BP 130/80 | HR 41 | Ht 68.0 in | Wt 164.0 lb

## 2020-12-01 DIAGNOSIS — G8929 Other chronic pain: Secondary | ICD-10-CM

## 2020-12-01 DIAGNOSIS — M545 Low back pain, unspecified: Secondary | ICD-10-CM

## 2020-12-01 DIAGNOSIS — M869 Osteomyelitis, unspecified: Secondary | ICD-10-CM

## 2020-12-01 NOTE — Patient Instructions (Signed)
Thank you for coming in today.  Plan a little PT.   Keep me updated.   Ok to continue running.   Recheck in 6 weeks.   Core strength will help.

## 2021-01-11 NOTE — Progress Notes (Signed)
   I, Wendy Poet, LAT, ATC, am serving as scribe for Dr. Lynne Leader.  Daniel Hobbs is a 50 y.o. male who presents to [place] at [facility] today for f/u of LBP and pelvic/purbic symphysis pain.  He was last seen by Dr. Georgina Snell on 12/01/20, noting con't LBP, and was referred to [facility].  Since his last visit, pt reports he has returned to being very active which has improved his back pain. Pt has been running about 45 miles a week. Pt notes no pain in his pelvis.  Diagnostic testing: Pelvic MRI- 10/29/20; B hip and L-spine XR- 08/08/20  Pertinent review of systems: No fevers or chills  Relevant historical information: History of osteitis pubis   Exam:  BP 100/76 (BP Location: Right Arm, Patient Position: Sitting, Cuff Size: Normal)   Pulse (!) 43   Ht 5\' 8"  (1.727 m)   Wt 158 lb 6.4 oz (71.8 kg)   SpO2 99%   BMI 24.08 kg/m  General: Well Developed, well nourished, and in no acute distress.   MSK: L-spine normal motion extension rotation lateral flexion limited flexion.    Lab and Radiology Results  EXAM: LUMBAR SPINE - COMPLETE 4+ VIEW   COMPARISON:  None.   FINDINGS: No fracture or spondylolisthesis is noted. Mild degenerative disc disease is noted at L2-3 and L3-4.   IMPRESSION: Mild multilevel degenerative disc disease. No acute abnormality is noted.     Electronically Signed   By: Marijo Conception M.D.   On: 08/08/2020 09:51  I, Lynne Leader, personally (independently) visualized and performed the interpretation of the images attached in this note.      Assessment and Plan: 50 y.o. male with low back pain significantly improved with home exercise program and some physical therapy exercise by his sister.  He has been able to resume running and is very happy with his progress.  Plan to continue home exercise and running.  If needed could progress to formal physical therapy in the future however watchful waiting for now.  Osteitis pubis  now little over 2 months since his injection.  Pain-free and able to run.  Watchful waiting.   Discussed warning signs or symptoms. Please see discharge instructions. Patient expresses understanding.   The above documentation has been reviewed and is accurate and complete Lynne Leader, M.D.

## 2021-01-12 ENCOUNTER — Ambulatory Visit: Payer: Managed Care, Other (non HMO) | Admitting: Family Medicine

## 2021-01-12 ENCOUNTER — Other Ambulatory Visit: Payer: Self-pay

## 2021-01-12 VITALS — BP 100/76 | HR 43 | Ht 68.0 in | Wt 158.4 lb

## 2021-01-12 DIAGNOSIS — G8929 Other chronic pain: Secondary | ICD-10-CM

## 2021-01-12 DIAGNOSIS — M869 Osteomyelitis, unspecified: Secondary | ICD-10-CM

## 2021-01-12 DIAGNOSIS — M545 Low back pain, unspecified: Secondary | ICD-10-CM | POA: Diagnosis not present

## 2021-01-12 NOTE — Patient Instructions (Signed)
Thank you for coming in today.   Recheck as needed.   Let me know if you would like a PT referral in the future although you are doing just fine now.   Recheck as needed for this or other issues.

## 2021-06-19 ENCOUNTER — Ambulatory Visit: Payer: Self-pay

## 2021-06-19 ENCOUNTER — Ambulatory Visit (INDEPENDENT_AMBULATORY_CARE_PROVIDER_SITE_OTHER): Payer: 59

## 2021-06-19 ENCOUNTER — Ambulatory Visit: Payer: 59 | Admitting: Family Medicine

## 2021-06-19 ENCOUNTER — Encounter: Payer: Self-pay | Admitting: Family Medicine

## 2021-06-19 ENCOUNTER — Other Ambulatory Visit: Payer: Self-pay

## 2021-06-19 VITALS — BP 120/78 | HR 43 | Ht 68.0 in | Wt 164.4 lb

## 2021-06-19 DIAGNOSIS — M7672 Peroneal tendinitis, left leg: Secondary | ICD-10-CM | POA: Diagnosis not present

## 2021-06-19 DIAGNOSIS — S93402A Sprain of unspecified ligament of left ankle, initial encounter: Secondary | ICD-10-CM

## 2021-06-19 DIAGNOSIS — S93492A Sprain of other ligament of left ankle, initial encounter: Secondary | ICD-10-CM | POA: Diagnosis not present

## 2021-06-19 DIAGNOSIS — M76822 Posterior tibial tendinitis, left leg: Secondary | ICD-10-CM

## 2021-06-19 NOTE — Progress Notes (Signed)
I, Wendy Poet, LAT, ATC, am serving as scribe for Dr. Lynne Leader.  Daniel Hobbs is a 51 y.o. male who presents to [place] at [facility] today for L ankle pain. Pt was previously seen by Dr. Georgina Snell on 01/12/21 for LBP and pubic symphysis pain. Today, pt c/o L ankle pain x 9 days when he rolled his ankle while stepping down off a curb.  Pt locates pain to his L medial and lateral malleoli and L medial calcaneus.  He has a hx of minor ankle sprains.  Prior to this injury he was running 50 miles a week.  His goal is to resume running that distance.  L ankle swelling: yes Aggravates: standing; walking; L ankle PF Treatments tried: ice; naproxen sodium; rest  Pertinent review of systems: No fevers or chills  Relevant historical information: Striae of osteitis pubis   Exam:  BP 120/78 (BP Location: Left Arm, Patient Position: Sitting, Cuff Size: Normal)   Pulse (!) 43   Ht 5\' 8"  (1.727 m)   Wt 164 lb 6.4 oz (74.6 kg)   SpO2 98%   BMI 25.00 kg/m  General: Well Developed, well nourished, and in no acute distress.   MSK: Left ankle swelling at syndesmosis area and into lateral ankle.  Otherwise normal. Range of motion limited plantarflexion and dorsiflexion otherwise normal. Tender palpation at syndesmosis and ATFL region and along tibial tunnel. Positive anterior drawer test.  Negative talar tilt test. Intact strength. Pulses capillary refill and sensation are intact distally.    Lab and Radiology Results  Diagnostic Limited MSK Ultrasound of: Left ankle Mild joint effusion medial joint line. Lateral joint line shows edema superficially to joint but no joint effusion. Bony structures otherwise normal-appearing Posterior tibialis tendon small amount of clear fluid tracks within tendon sheath and tibial tunnel area.  Tendon is intact with no visible tear present. Peroneal tendon intact. Small amount of hypoechoic fluid tracks within tendon sheath distal to  lateral malleolus. Impression: Joint effusion.  Evidence of posterior tibialis tendinitis and peroneal tendinitis.   X-ray images left ankle obtained today personally and independently interpreted Tiny avulsion fragment at medial malleolus.  No other acute abnormalities.  No large fracture visible.  No severe degenerative changes. Await formal radiology review  Assessment and Plan: 50 y.o. male with left ankle injury occurring about 9 days ago.  Clinically patient has evidence of a high ankle sprain.  Additionally has some evidence of mild tenosynovitis of the posterior tibialis tendon and peroneal tendons. Plan for physical therapy referral, body of the compression sleeve, and NSAIDs.  Discussed dosing safety.  Recheck in 1 month.  Return sooner if needed.   PDMP not reviewed this encounter. Orders Placed This Encounter  Procedures   [place] LIMITED JOINT SPACE STRUCTURES LOW LEFT(NO LINKED CHARGES)    Order Specific Question:   Reason for Exam (SYMPTOM  OR DIAGNOSIS REQUIRED)    Answer:   L ankle pain    Order Specific Question:   Preferred imaging location?    Answer:   [place]   DG Ankle Complete Left    Standing Status:   Future    Standing Expiration Date:   07/19/2021    Order Specific Question:   Reason for Exam (SYMPTOM  OR DIAGNOSIS REQUIRED)    Answer:   L ankle pain    Order Specific Question:   Preferred imaging location?    Answer:   [place]   Ambulatory referral  to Physical Therapy    Referral Priority:   Routine    Referral Type:   Physical Medicine    Referral Reason:   Specialty Services Required    Requested Specialty:   Physical Therapy    Number of Visits Requested:   1   No orders of the defined types were placed in this encounter.    Discussed warning signs or symptoms. Please see discharge instructions. Patient expresses understanding.   The above documentation has been reviewed and is accurate and complete Lynne Leader, M.D.

## 2021-06-19 NOTE — Patient Instructions (Addendum)
Good to see you today.  Please get an Xray today before you leave.  I've referred you to [facility] for PT.  Please let us know if you're not hearing from them in the next week regarding scheduling.  I recommend you obtained a compression sleeve to help with your joint problems. There are many options on the market however I recommend obtaining a ankle Body Helix compression sleeve.  You can find information (including how to appropriate measure yourself for sizing) can be found at www.Body http://www.lambert.com/.  Many of these products are health savings account (HSA) eligible.   You can use the compression sleeve at any time throughout the day but is most important to use while being active as well as for 2 hours post-activity.   It is appropriate to ice following activity with the compression sleeve in place.   Follow-up: one month

## 2021-06-20 NOTE — Progress Notes (Signed)
Left ankle x-ray does not show a fracture.

## 2021-07-16 NOTE — Progress Notes (Signed)
° °  I, Wendy Poet, LAT, ATC, am serving as scribe for Dr. Lynne Leader.  Daniel Hobbs is a 50 y.o. male who presents to [place] at [facility] today for f/u of a L high ankle sprain after rolling his ankle while stepping off of a curb while running.  He was last seen by Dr. Georgina Snell on 06/19/21 and was referred to [facility], completing 4 visits.  He was also advised to purchase an ankle compression sleeve.  Today, pt reports L ankle is getting better. Pt notes continued some pain w/ certain motions and lack of AROM. Pt reports swelling will occur after doing exercises or being on his feet a lot along the anterior-medial aspect of the L ankle. Pt has not yet returned to running.  Diagnostic testing: L ankle R- 06/19/21  Pertinent review of systems: No fevers or chills  Relevant historical information: History of osteitis pubis   Exam:  BP 110/68    Pulse (!) 41    Ht 5\' 8"  (1.727 m)    Wt 165 lb 3.2 oz (74.9 kg)    SpO2 99%    BMI 25.12 kg/m  General: Well Developed, well nourished, and in no acute distress.   MSK: Left ankle slight swelling.  Tender palpation and anterior medial ankle.   Range of motion slight limitation plantarflexion and dorsiflexion otherwise intact. Stable ligamentous exam. Intact strength.    Lab and Radiology Results EXAM: LEFT ANKLE COMPLETE - 3+ VIEW   COMPARISON:  None.   FINDINGS: Mild lateral soft tissue swelling is noted. No acute fracture or dislocation is seen.   IMPRESSION: Soft tissue swelling without acute bony abnormality.     Electronically Signed   By: Inez Catalina M.D.   On: 06/19/2021 21:48   I, Lynne Leader, personally (independently) visualized and performed the interpretation of the images attached in this note.    Assessment and Plan: 50 y.o. male with high ankle sprain with likely deltoid ligament involvement.  Ankle sprain is now about 92 month old. Daniel Hobbs has improved quite a bit with physical  therapy and a bit of time.  Plan to continue PT and advance activity as tolerated.  If not improved in the next 2 to 4 weeks significantly could consider MRI.  He will let me know.  Advance activity as tolerated and per PT.  Recheck back as needed.   Discussed warning signs or symptoms. Please see discharge instructions. Patient expresses understanding.   The above documentation has been reviewed and is accurate and complete Lynne Leader, M.D.  Total encounter time 20 minutes including face-to-face time with the patient and, reviewing past medical record, and charting on the date of service.   Treatment plan and options and precautions.

## 2021-07-19 ENCOUNTER — Ambulatory Visit: Payer: 59 | Admitting: Family Medicine

## 2021-07-19 ENCOUNTER — Other Ambulatory Visit: Payer: Self-pay

## 2021-07-19 VITALS — BP 110/68 | HR 41 | Ht 68.0 in | Wt 165.2 lb

## 2021-07-19 DIAGNOSIS — S93492D Sprain of other ligament of left ankle, subsequent encounter: Secondary | ICD-10-CM

## 2021-07-19 NOTE — Patient Instructions (Addendum)
Thank you for coming in today.   Continue physical therapy  Let me know if you aren't improving and we can order a MRI

## 2021-07-30 ENCOUNTER — Encounter: Payer: Self-pay | Admitting: Family Medicine

## 2021-07-30 DIAGNOSIS — M25572 Pain in left ankle and joints of left foot: Secondary | ICD-10-CM

## 2021-07-30 DIAGNOSIS — S93492D Sprain of other ligament of left ankle, subsequent encounter: Secondary | ICD-10-CM

## 2021-07-30 DIAGNOSIS — G8929 Other chronic pain: Secondary | ICD-10-CM

## 2021-08-05 ENCOUNTER — Other Ambulatory Visit: Payer: Self-pay

## 2021-08-05 ENCOUNTER — Ambulatory Visit (INDEPENDENT_AMBULATORY_CARE_PROVIDER_SITE_OTHER): Payer: 59

## 2021-08-05 DIAGNOSIS — G8929 Other chronic pain: Secondary | ICD-10-CM

## 2021-08-05 DIAGNOSIS — M25572 Pain in left ankle and joints of left foot: Secondary | ICD-10-CM

## 2021-08-05 DIAGNOSIS — S93492D Sprain of other ligament of left ankle, subsequent encounter: Secondary | ICD-10-CM

## 2021-08-06 ENCOUNTER — Encounter: Payer: Self-pay | Admitting: Family Medicine

## 2021-08-06 NOTE — Progress Notes (Signed)
MRI left ankle does show a partial tear of the deltoid ligament on the medial aspect of the ankle and some tendinitis.  On the lateral outside part of the ankle there is a bone bruise of some of the ankle bones and tendinitis.  Continued rehab and time should help.  Surgery should not be needed.  This is going to take longer to heal than would be typical for an ankle sprain.

## 2021-08-08 MED ORDER — NITROGLYCERIN 0.2 MG/HR TD PT24: MEDICATED_PATCH | TRANSDERMAL | 1 refills | Status: DC

## 2021-11-12 ENCOUNTER — Ambulatory Visit: Payer: 59 | Admitting: Sports Medicine

## 2021-11-12 VITALS — BP 108/70 | HR 49 | Ht 68.0 in | Wt 170.0 lb

## 2021-11-12 DIAGNOSIS — M9905 Segmental and somatic dysfunction of pelvic region: Secondary | ICD-10-CM

## 2021-11-12 DIAGNOSIS — M9904 Segmental and somatic dysfunction of sacral region: Secondary | ICD-10-CM

## 2021-11-12 DIAGNOSIS — M9903 Segmental and somatic dysfunction of lumbar region: Secondary | ICD-10-CM

## 2021-11-12 DIAGNOSIS — G8929 Other chronic pain: Secondary | ICD-10-CM

## 2021-11-12 DIAGNOSIS — M545 Low back pain, unspecified: Secondary | ICD-10-CM | POA: Diagnosis not present
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
## 2021-11-12 NOTE — Patient Instructions (Addendum)
Good to see you  ?Core and back exercises given ?Follow up in 2-3 week OMT follow up  ?

## 2021-11-28 NOTE — Progress Notes (Signed)
?   Benito Mccreedy D.Merril Abbe ?[facility] ?[place] ?Phone: [phone] ?  ?Assessment and Plan:   ?  ?1. Chronic right low back pain without sciatica ?2. Somatic dysfunction of lumbar region ?3. Somatic dysfunction of pelvic region ?4. Somatic dysfunction of sacral region ?-Chronic with exacerbation, subsequent visit ?- Continued right lower back pain, though overall improved after previous OMT treatment ?- Continue HEP for core and posterior chain strengthening program.  Continue focus on tight right hip flexors ?- Patient has received significant relief with OMT in the past.  Elects for repeat OMT today.  Tolerated well per note below. ?- Decision today to treat with OMT was based on Physical Exam ?  ?After verbal consent patient was treated with HVLA (high velocity low amplitude), ME (muscle energy), FPR (flex positional release), ST (soft tissue), PC/PD (Pelvic Compression/ Pelvic Decompression) techniques in sacrum, lumbar, and pelvic areas. Patient tolerated the procedure well with improvement in symptoms.  Patient educated on potential side effects of soreness and recommended to rest, hydrate, and use Tylenol as needed for pain control. ?  ?Pertinent previous records reviewed include none ?  ?Follow Up: 1 week to discuss acute on chronic left lower extremity pain, calf pain, ankle pain with running, ?  ?Subjective:   ?I, [name], am serving as a Education administrator for [employer] ? ?Chief Complaint: low back pain  ?  ?HPI:  ?  ?11/12/21 ?Patient is a 51 year old male complaining of low back pain. Patient states that he has bilateral low back/Si joint pain. Will take aleve if needed, but does stretches and exercises to help with the pain. ?Patient used to be a long distance runner. States that when he consistently runs he has no back pian, but when not running will have the low back/SI joint pain.  ? ?11/30/2021 ?Patient states he is good, tune up today  ?   ?Relevant Historical Information: herniated disk surgery 30years ago ? ?Additional pertinent review of systems negative. ? ?Current Outpatient Medications  ?Medication Sig Dispense Refill  ? nitroGLYCERIN (NITRODUR - DOSED IN MG/24 HR) 0.2 mg/hr patch Apply 1/4 patch daily to tendon for tendonitis. 30 patch 1  ? VITAMIN D PO Take 1 tablet by mouth daily.    ? ?No current facility-administered medications for this visit.  ?  ?  ?Objective:   ?  ?Vitals:  ? 11/30/21 1233  ?BP: 120/78  ?Pulse: (!) 47  ?SpO2: 98%  ?Weight: 167 lb (75.8 kg)  ?Height: '5\' 8"'$  (1.727 m)  ?  ?  ?Body mass index is 25.39 kg/m?.  ?  ?Physical Exam:   ?  ?General: Well-appearing, cooperative, sitting comfortably in no acute distress.  ? ?OMT Physical Exam: ? ?ASIS Compression Test: Positive Right ?Sacrum: TTP right sacral base, positive sphinx ?Lumbar: TTP paraspinal, L5 rotated left ?Pelvis: Right anterior innominate ? ?Electronically signed by:  ?Benito Mccreedy D.Merril Abbe ?[facility] ?12:52 PM 11/30/21             ?

## 2021-11-30 ENCOUNTER — Ambulatory Visit: Payer: 59 | Admitting: Sports Medicine

## 2021-11-30 VITALS — BP 120/78 | HR 47 | Ht 68.0 in | Wt 167.0 lb

## 2021-11-30 DIAGNOSIS — M9905 Segmental and somatic dysfunction of pelvic region: Secondary | ICD-10-CM | POA: Diagnosis not present

## 2021-11-30 DIAGNOSIS — M545 Low back pain, unspecified: Secondary | ICD-10-CM | POA: Diagnosis not present

## 2021-11-30 DIAGNOSIS — G8929 Other chronic pain: Secondary | ICD-10-CM | POA: Diagnosis not present

## 2021-11-30 DIAGNOSIS — M9903 Segmental and somatic dysfunction of lumbar region: Secondary | ICD-10-CM

## 2021-11-30 DIAGNOSIS — M9904 Segmental and somatic dysfunction of sacral region: Secondary | ICD-10-CM | POA: Diagnosis not present

## 2021-11-30 NOTE — Patient Instructions (Addendum)
Good to see you  ?Follow up next week to talk about ankle  ?

## 2021-12-06 NOTE — Progress Notes (Signed)
? ? Daniel Hobbs ?Daniel Hobbs ?Daniel Hobbs ?Phone: [phone] ?  ?Assessment and Plan:   ?  ?1. Chronic pain of left ankle ?2. Osteochondral lesion of talar dome ?3. Sprain of deltoid ligament of left ankle, sequela ?-Chronic with exacerbation, subsequent visit ?- Overall improvement in ankle pain since MRI in 07/2021 showed low-grade talar dome osteochondral injury, partial anterior tearing of the deltoid ligament, tenosynovitis of posterior tibial and peroneal tendons.  I believe that these injuries had moderate improvement with relative rest over the past 4 months.,  However they have led to tightening and dysfunction of ankle, gastroc, soleus, quadriceps and hamstring musculature, as well as ankle dysfunction that will occasionally cause a burning sensation in medial ankle ?- X-ray repeated at today's visit.  My interpretation: No acute fracture or dislocation.  Area of subchondral injury apparent without displacement. ?- We will focus on physical therapy and home exercise program to help regain baseline function.  Advised focusing on ankle, but including calf and thigh musculature as well ? ?  ?Pertinent previous records reviewed include ankle x-ray 06/19/2021, ankle MRI 08/05/2021 ?  ?Follow Up: 3 to 4 weeks for reevaluation.  We will discuss improvement in patient's left lower extremity symptoms as well as low back symptoms.  Could repeat OMT.  If no improvement or worsening of ankle symptoms, would repeat MRI as osteochondral lesion should have healed by this point ?  ?Subjective:   ?I, Daniel Hobbs, am serving as a Education administrator for Daniel Hobbs ? ?Chief Complaint: left ankle pain  ? ?HPI:  ? ?12/07/2021 ?Patient is a 51 year old male complaining of left ankle pain. Patient states he had a high ankle sprain November 2022 and its still trying to heal, gets occasional pain on the medial aspect of the ankle had an osteochondral lesion that was  in the joint, no numbness or tingling, lots of locking clicking and popping, has not been taking meds for it was taking nitroglycerin patches but was receiving no relief, whole left leg has had challenges the calf continues to be different spots of micro tears ? ?Relevant Historical Information: Osteochondral lesion of left ankle, medial ankle sprain with anterior deltoid ligament tearing ? ?Additional pertinent review of systems negative. ? ? ?Current Outpatient Medications:  ?  nitroGLYCERIN (NITRODUR - DOSED IN MG/24 HR) 0.2 mg/hr patch, Apply 1/4 patch daily to tendon for tendonitis., Disp: 30 patch, Rfl: 1 ?  VITAMIN D PO, Take 1 tablet by mouth daily., Disp: , Rfl:   ? ?Objective:   ?  ?Vitals:  ? 12/07/21 1221  ?BP: 100/70  ?Pulse: (!) 42  ?SpO2: 98%  ?Weight: 169 lb (76.7 kg)  ?Height: '5\' 8"'$  (1.727 m)  ?  ?  ?Body mass index is 25.7 kg/m?.  ?  ?Physical Exam:   ? ?Gen: Appears well, nad, nontoxic and pleasant ?Psych: Alert and oriented, appropriate mood and affect ?Neuro: sensation intact, strength is 5/5 with df/pf/inv/ev, muscle tone wnl ?Skin: no susupicious lesions or rashes ? ?Left ankle: no deformity, no swelling or effusion ?Mild TTP medial malleoli, deltoid ?NTTP over fibular head, lat mal,   achilles, navicular, base of 5th, ATFL, CFL,  , calcaneous or midfoot ?ROM DF 25, PF 40, inv/ev intact ?Negative ant drawer, talar tilt, rotation test, squeeze test. ?Neg thompson ?No pain with resisted inversion or eversion  ?TTP gastroc musculature ?Single-leg ankle extension more difficult on the left versus right with full weightbearing ? ?  Electronically signed by:  ?Daniel Hobbs ?[facility] ?12:53 PM 12/07/21 ?

## 2021-12-07 ENCOUNTER — Ambulatory Visit: Payer: 59 | Admitting: Sports Medicine

## 2021-12-07 ENCOUNTER — Ambulatory Visit (INDEPENDENT_AMBULATORY_CARE_PROVIDER_SITE_OTHER): Payer: 59

## 2021-12-07 VITALS — BP 100/70 | HR 42 | Ht 68.0 in | Wt 169.0 lb

## 2021-12-07 DIAGNOSIS — M899 Disorder of bone, unspecified: Secondary | ICD-10-CM

## 2021-12-07 DIAGNOSIS — G8929 Other chronic pain: Secondary | ICD-10-CM | POA: Diagnosis not present

## 2021-12-07 DIAGNOSIS — S93422S Sprain of deltoid ligament of left ankle, sequela: Secondary | ICD-10-CM

## 2021-12-07 DIAGNOSIS — M25572 Pain in left ankle and joints of left foot: Secondary | ICD-10-CM

## 2021-12-07 DIAGNOSIS — M949 Disorder of cartilage, unspecified: Secondary | ICD-10-CM

## 2021-12-07 NOTE — Patient Instructions (Addendum)
Good to see you  ?Pt referral  ?HEP ankle  ?3-4 week follow up  ? ? ?

## 2022-01-03 NOTE — Progress Notes (Signed)
    Daniel Hobbs D.Daniel Hobbs Phone: [phone]   Assessment and Plan:     1. Chronic pain of left ankle 2. Osteochondral lesion of talar dome -Chronic with exacerbation, subsequent visit - Overall moderate improvement since previous office visit with physical therapy, dry needling, home exercises focusing on ankle strengthening and stretching - Suspect that osteochondral lesion has fully healed at this time based on improvement in ankle pain, with patient returning to light running up to 1 mile.  Patient's discomfort was most likely related to muscular weakening that developed over 4 months of relative rest of ankle - Continue HEP and physical therapy for ankle strengthening and stretching to return to full ROM - May gradually increase running activities as tolerated    Pertinent previous records reviewed include none   Follow Up: As needed   Subjective:   I, Daniel Hobbs, am serving as a scribe for Dr. Glennon Mac   Chief Complaint: left ankle pain    HPI:    12/07/2021 Patient is a 51 year old male complaining of left ankle pain. Patient states he had a high ankle sprain November 2022 and its still trying to heal, gets occasional pain on the medial aspect of the ankle had an osteochondral lesion that was in the joint, no numbness or tingling, lots of locking clicking and popping, has not been taking meds for it was taking nitroglycerin patches but was receiving no relief, whole left leg has had challenges the calf continues to be different spots of micro tears  01/04/2022 Patient states that ankle is doing pretty well, has been back to PT as requested and has been getting dry needling in the calf.     Relevant Historical Information: Osteochondral lesion of left ankle, medial ankle sprain with anterior deltoid ligament tearing  Additional pertinent review of systems negative.  No current outpatient  medications on file.   Objective:     Vitals:   01/04/22 1244  BP: 110/76  Pulse: (!) 47  SpO2: 97%  Weight: 164 lb (74.4 kg)  Height: '5\' 8"'$  (1.727 m)      Body mass index is 24.94 kg/m.    Physical Exam:    Gen: Appears well, nad, nontoxic and pleasant Psych: Alert and oriented, appropriate mood and affect Neuro: sensation intact, strength is 5/5 with df/pf/inv/ev, muscle tone wnl Skin: no susupicious lesions or rashes  Left ankle: no deformity, no swelling or effusion NTTP over fibular head, lat mal, medial mal, achilles, navicular, base of 5th, ATFL, CFL, deltoid, calcaneous or midfoot ROM DF 25, PF 40, inv/ev intact Negative ant drawer, talar tilt, rotation test, squeeze test. Neg thompson No pain with resisted inversion or eversion    Electronically signed by:  Daniel Hobbs D.[facility] 1:03 PM 01/04/22

## 2022-01-04 ENCOUNTER — Ambulatory Visit: Payer: 59 | Admitting: Sports Medicine

## 2022-01-04 VITALS — BP 110/76 | HR 47 | Ht 68.0 in | Wt 164.0 lb

## 2022-01-04 DIAGNOSIS — G8929 Other chronic pain: Secondary | ICD-10-CM

## 2022-01-04 DIAGNOSIS — M25572 Pain in left ankle and joints of left foot: Secondary | ICD-10-CM | POA: Diagnosis not present

## 2022-01-04 DIAGNOSIS — M899 Disorder of bone, unspecified: Secondary | ICD-10-CM

## 2022-01-04 DIAGNOSIS — M949 Disorder of cartilage, unspecified: Secondary | ICD-10-CM

## 2022-01-04 NOTE — Patient Instructions (Signed)
Good to see you   Follow up in  

## 2022-01-31 NOTE — Progress Notes (Signed)
    Daniel Hobbs D.[facility] [place] Phone: [phone]   Assessment and Plan:     1. Acute pain of right shoulder 2. Strain of right rotator cuff capsule, initial encounter -Acute, uncomplicated, initial sports medicine visit - Consistent with strains of supraspinatus and infraspinatus based on HPI and physical exam that is significantly improved over the past 2 weeks with activity modification and as needed NSAID use.  No red flag symptoms, so no additional imaging today - Start HEP - Continue NSAIDs/Tylenol as needed   Pertinent previous records reviewed include none   Follow Up: As needed if no improvement or worsening of symptoms   Subjective:   I, Daniel Hobbs, am serving as a Education administrator for Daniel Hobbs  Chief Complaint: right shoulder pain   HPI:   02/01/2022 Patient is a 51 year old male complaining of right shoulder pain. Patient states that he aggravated his shoulder to where it wasn't mobil for days but it has calmed down since still has a little residual pain , was lifting a heavy object from the ground to the truck just bad form almost 2 weeks ago. No tingling, no locking clicking or popping, took lost of motrin and ice helped,   Relevant Historical Information: History of right rotator cuff repair  Additional pertinent review of systems negative.  No current outpatient medications on file.   Objective:     Vitals:   02/01/22 0759  BP: 100/80  Pulse: (!) 45  SpO2: 98%  Weight: 164 lb (74.4 kg)  Height: '5\' 8"'$  (1.727 m)      Body mass index is 24.94 kg/m.    Physical Exam:    Gen: Appears well, nad, nontoxic and pleasant Neuro:sensation intact, strength is 5/5 with df/pf/inv/ev, muscle tone wnl Skin: no suspicious lesion or defmority Psych: A&O, appropriate mood and affect  Right shoulder: no deformity, swelling or muscle wasting No scapular winging FF 180, abd 180, int 0,  ext 90 NTTP over the [place], clavicle, ac, coracoid, biceps groove, humerus, deltoid, trapezius, cervical spine Positive Hawking's, empty can, O'Brien Equivocal subscap lift off Neg neer,   speeds,  , crossarm Neg ant drawer, sulcus sign, apprehension Negative Spurling's test bilat FROM of neck    Electronically signed by:  Daniel Hobbs D.[facility] 8:25 AM 02/01/22

## 2022-02-01 ENCOUNTER — Ambulatory Visit: Payer: 59 | Admitting: Sports Medicine

## 2022-02-01 VITALS — BP 100/80 | HR 45 | Ht 68.0 in | Wt 164.0 lb

## 2022-02-01 DIAGNOSIS — S46011A Strain of muscle(s) and tendon(s) of the rotator cuff of right shoulder, initial encounter: Secondary | ICD-10-CM | POA: Diagnosis not present

## 2022-02-01 DIAGNOSIS — M25511 Pain in right shoulder: Secondary | ICD-10-CM | POA: Diagnosis not present

## 2022-02-01 NOTE — Patient Instructions (Addendum)
Good to see you  Shoulder HEP  As needed follow up  

## 2022-07-31 NOTE — Progress Notes (Unsigned)
    Benito Mccreedy D.[place] Phone: [phone]   Assessment and Plan:     There are no diagnoses linked to this encounter.  ***   Pertinent previous records reviewed include ***   Follow Up: ***     Subjective:   I, Wetzel Meester, am serving as a Education administrator for Doctor Glennon Mac  Chief Complaint: LBP  HPI:   08/01/2022 Patient is a 52 year old male complaining of low back pain. Patient states  Relevant Historical Information: ***  Additional pertinent review of systems negative.  No current outpatient medications on file.   Objective:     There were no vitals filed for this visit.    There is no height or weight on file to calculate BMI.    Physical Exam:    ***   Electronically signed by:  Benito Mccreedy D.[facility] 12:14 PM 07/31/22

## 2022-08-01 ENCOUNTER — Ambulatory Visit: Payer: 59 | Admitting: Sports Medicine

## 2022-08-01 VITALS — BP 110/68 | Ht 68.0 in | Wt 164.0 lb

## 2022-08-01 DIAGNOSIS — G8929 Other chronic pain: Secondary | ICD-10-CM | POA: Diagnosis not present

## 2022-08-01 DIAGNOSIS — M533 Sacrococcygeal disorders, not elsewhere classified: Secondary | ICD-10-CM | POA: Diagnosis not present

## 2022-08-01 DIAGNOSIS — M545 Low back pain, unspecified: Secondary | ICD-10-CM

## 2022-08-01 MED ORDER — MELOXICAM 15 MG PO TABS: 15.0000 mg | ORAL_TABLET | Freq: Every day | ORAL | 0 refills | Status: AC

## 2022-08-01 MED ORDER — CYCLOBENZAPRINE HCL 5 MG PO TABS: 5.0000 mg | ORAL_TABLET | Freq: Every day | ORAL | 0 refills | Status: AC

## 2022-08-01 NOTE — Patient Instructions (Signed)
-   Start meloxicam 15 mg daily x2 weeks.  If still having pain after 2 weeks, complete 3rd-week of meloxicam. May use remaining meloxicam as needed once daily for pain control.  Do not to use additional NSAIDs while taking meloxicam.  May use Tylenol [phone] mg 2 to 3 times a day for breakthrough pain. Flexeril 5-10 mg nightly as needed for muscle spasm Continue low back exercises  3-4 week follow up

## 2022-08-21 NOTE — Progress Notes (Unsigned)
   Daniel Mccreedy D.[place] Phone: [phone]   Assessment and Plan:     There are no diagnoses linked to this encounter.  *** - Patient has received significant relief with OMT in the past.  Elects for repeat OMT today.  Tolerated well per note below. - Decision today to treat with OMT was based on Physical Exam   After verbal consent patient was treated with HVLA (high velocity low amplitude), ME (muscle energy), FPR (flex positional release), ST (soft tissue), PC/PD (Pelvic Compression/ Pelvic Decompression) techniques in cervical, rib, thoracic, lumbar, and pelvic areas. Patient tolerated the procedure well with improvement in symptoms.  Patient educated on potential side effects of soreness and recommended to rest, hydrate, and use Tylenol as needed for pain control.   Pertinent previous records reviewed include ***   Follow Up: ***     Subjective:   I, Daniel Hobbs, am serving as a Education administrator for Doctor Glennon Mac   Chief Complaint: LBP   HPI:    08/01/2022 Patient is a 52 year old male complaining of low back pain. Patient states that his low back is flared woke up with it the other day pain around the SI joint, hx of back surgery L4-L5 herniated disc, OMT is on the table , no numbness or tingling, more sciatic type pain, tylenol 1 time for the pain didn't help   08/22/2022 Patient states   Relevant Historical Information: None pertinent    Additional pertinent review of systems negative.  Current Outpatient Medications  Medication Sig Dispense Refill   cyclobenzaprine (FLEXERIL) 5 MG tablet Take 1 tablet (5 mg total) by mouth at bedtime. 30 tablet 0   meloxicam (MOBIC) 15 MG tablet Take 1 tablet (15 mg total) by mouth daily. 30 tablet 0   No current facility-administered medications for this visit.      Objective:     There were no vitals filed for this visit.    There is no height or weight on  file to calculate BMI.    Physical Exam:     General: Well-appearing, cooperative, sitting comfortably in no acute distress.   OMT Physical Exam:  ASIS Compression Test: Positive Right Cervical: TTP paraspinal, *** Rib: Bilateral elevated first rib with TTP Thoracic: TTP paraspinal,*** Lumbar: TTP paraspinal,*** Pelvis: Right anterior innominate  Electronically signed by:  [facility] 11:53 AM 08/21/22

## 2022-08-22 ENCOUNTER — Ambulatory Visit: Payer: Self-pay

## 2022-08-22 ENCOUNTER — Ambulatory Visit: Payer: 59 | Admitting: Sports Medicine

## 2022-08-22 VITALS — BP 110/80 | HR 53 | Ht 68.0 in | Wt 166.0 lb

## 2022-08-22 DIAGNOSIS — M533 Sacrococcygeal disorders, not elsewhere classified: Secondary | ICD-10-CM | POA: Diagnosis not present

## 2022-08-22 DIAGNOSIS — G8929 Other chronic pain: Secondary | ICD-10-CM | POA: Diagnosis not present

## 2022-08-22 DIAGNOSIS — M545 Low back pain, unspecified: Secondary | ICD-10-CM

## 2022-08-22 NOTE — Patient Instructions (Addendum)
Good to see you Gradual re-introduction into physical activity  May use meloxicam and flexeril as needed  3 week follow up

## 2022-08-28 ENCOUNTER — Other Ambulatory Visit: Payer: Self-pay | Admitting: Sports Medicine

## 2022-09-11 NOTE — Progress Notes (Unsigned)
    Daniel Hobbs D.[place] Phone: [phone]   Assessment and Plan:     There are no diagnoses linked to this encounter.  ***   Pertinent previous records reviewed include ***   Follow Up: ***     Subjective:   I, Daniel Hobbs, am serving as a Education administrator for Doctor Glennon Mac   Chief Complaint: LBP   HPI:    08/01/2022 Patient is a 52 year old male complaining of low back pain. Patient states that his low back is flared woke up with it the other day pain around the SI joint, hx of back surgery L4-L5 herniated disc, OMT is on the table , no numbness or tingling, more sciatic type pain, tylenol 1 time for the pain didn't help    08/22/2022 Patient states meds cut the edge but still the lingering issue    09/12/2022 Patient states    Relevant Historical Information: None pertinent  Additional pertinent review of systems negative.   Current Outpatient Medications:    cyclobenzaprine (FLEXERIL) 5 MG tablet, Take 1 tablet (5 mg total) by mouth at bedtime., Disp: 30 tablet, Rfl: 0   meloxicam (MOBIC) 15 MG tablet, Take 1 tablet (15 mg total) by mouth daily., Disp: 30 tablet, Rfl: 0   Objective:     There were no vitals filed for this visit.    There is no height or weight on file to calculate BMI.    Physical Exam:    ***   Electronically signed by:  Daniel Hobbs D.[facility] 7:38 AM 09/11/22

## 2022-09-12 ENCOUNTER — Ambulatory Visit: Payer: 59 | Admitting: Sports Medicine

## 2022-09-12 VITALS — BP 110/80 | Ht 68.0 in | Wt 164.0 lb

## 2022-09-12 DIAGNOSIS — M9904 Segmental and somatic dysfunction of sacral region: Secondary | ICD-10-CM

## 2022-09-12 DIAGNOSIS — M533 Sacrococcygeal disorders, not elsewhere classified: Secondary | ICD-10-CM

## 2022-09-12 DIAGNOSIS — G8929 Other chronic pain: Secondary | ICD-10-CM | POA: Diagnosis not present

## 2022-09-12 DIAGNOSIS — M9903 Segmental and somatic dysfunction of lumbar region: Secondary | ICD-10-CM | POA: Diagnosis not present

## 2022-09-12 DIAGNOSIS — M545 Low back pain, unspecified: Secondary | ICD-10-CM

## 2022-09-12 DIAGNOSIS — M9905 Segmental and somatic dysfunction of pelvic region: Secondary | ICD-10-CM

## 2022-09-12 NOTE — Patient Instructions (Signed)
Good to see you   

## 2022-10-24 ENCOUNTER — Ambulatory Visit: Payer: 59 | Admitting: Sports Medicine

## 2023-05-10 IMAGING — MR MR ANKLE*L* W/O CM
4 of 6 series · 28 of 40 positions shown · non-contrast
Comparison: None.

CLINICAL DATA: Ankle pain, osteochondral lesion suspected, neg xray

EXAM:
MRI OF THE LEFT ANKLE WITHOUT CONTRAST
TECHNIQUE: Multiplanar, multisequence MR imaging of the ankle was performed. No
intravenous contrast was administered.

[Series 3: PD fat-sat · axial · 3.0mm · 0.70mm/px · z∈[-96,+32]mm · 7 of 40 slices shown]
[im 1/40]
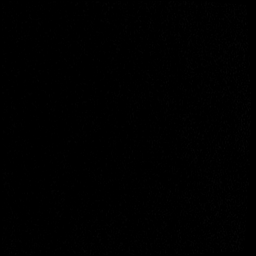
[im 7/40]
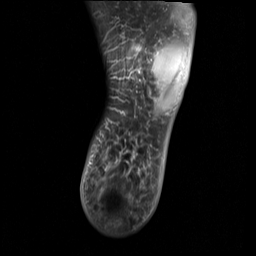
[im 14/40]
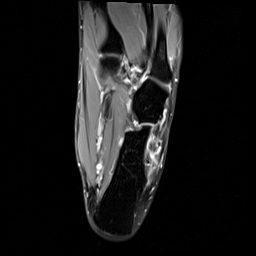
[im 20/40]
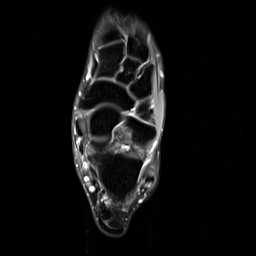
[im 27/40]
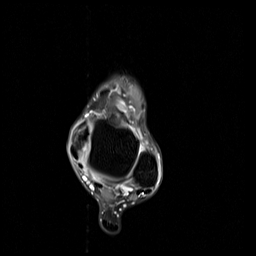
[im 33/40]
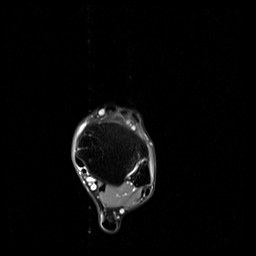
[im 40/40]
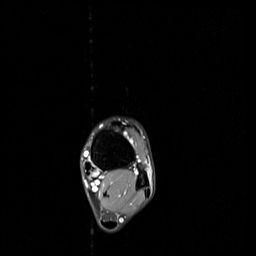

[Series 4: T2 fat-sat · axial · 3.0mm · 0.70mm/px · z∈[-96,+32]mm · 7 of 39 slices shown (1 of 3)]
[im 1/39]
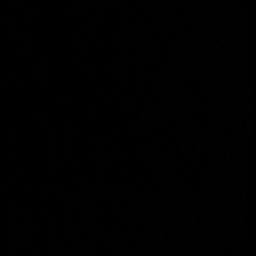
[im 7/39]
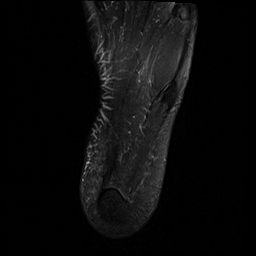
[im 13/39]
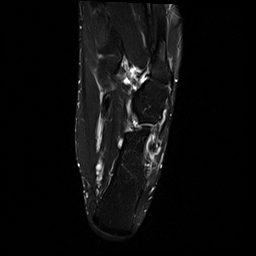
[im 20/39]
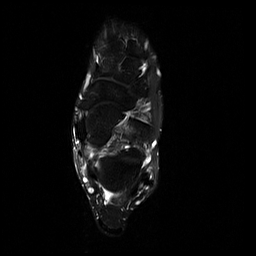
[im 26/39]
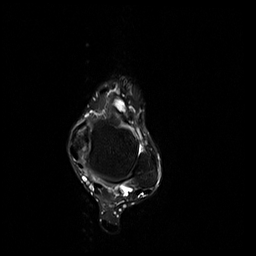
[im 32/39]
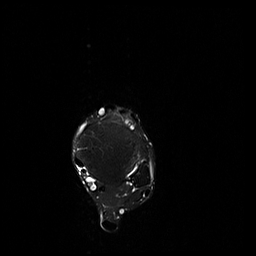
[im 39/39]
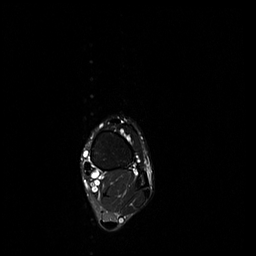

[Series 5: T2 fat-sat · coronal · 3.0mm · 0.31mm/px · 8 of 40 slices shown (2 of 3)]
[im 1/40]
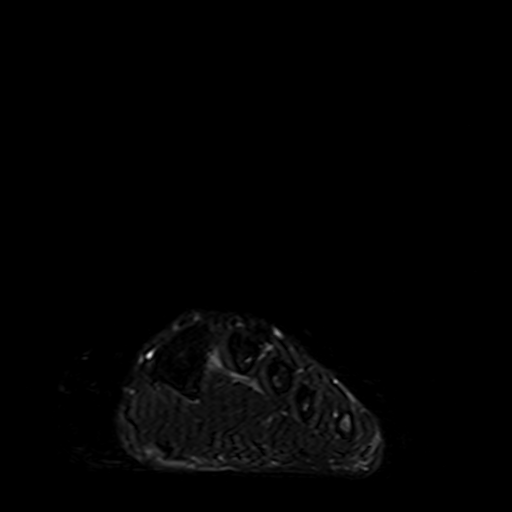
[im 6/40]
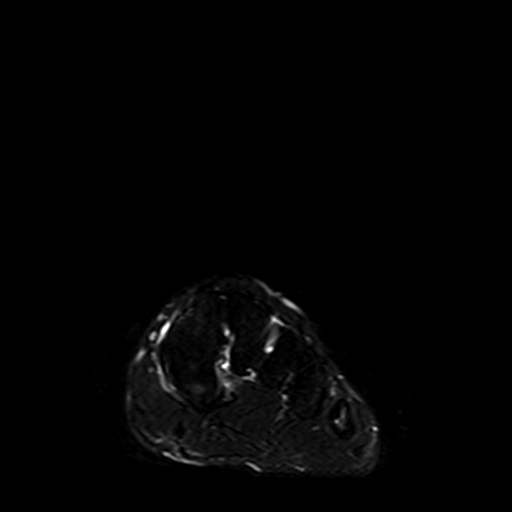
[im 12/40]
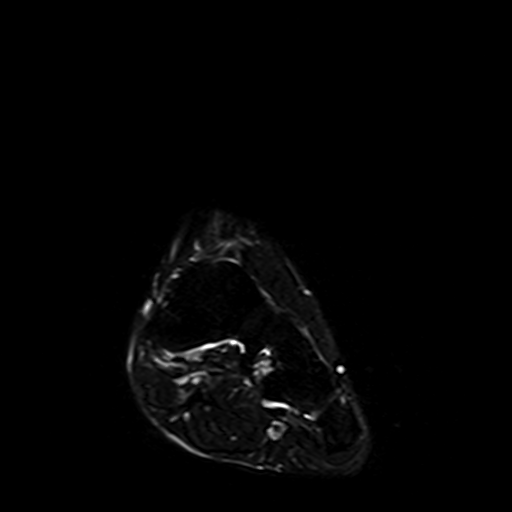
[im 17/40]
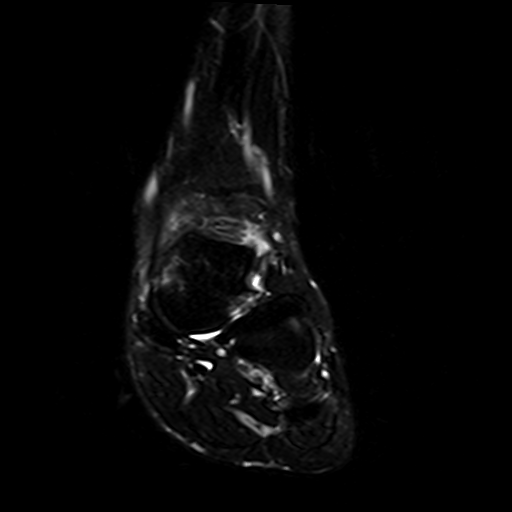
[im 23/40]
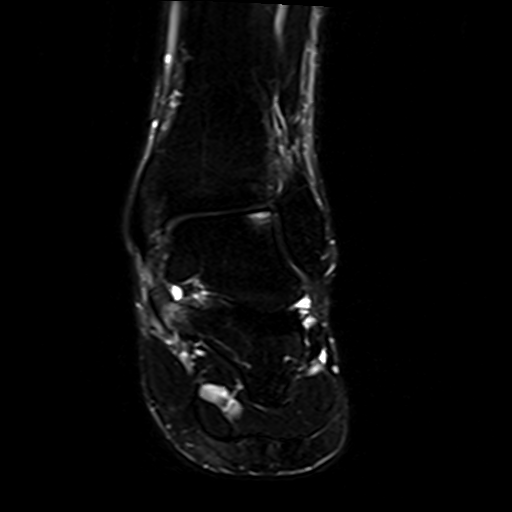
[im 28/40]
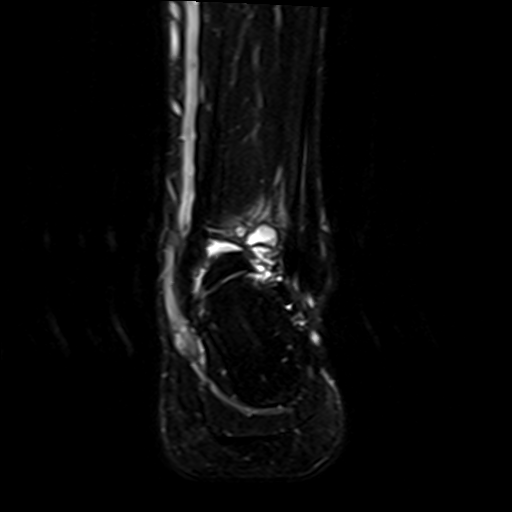
[im 34/40]
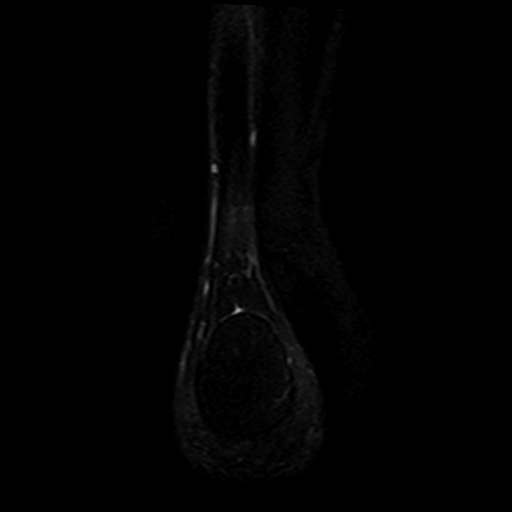
[im 40/40]
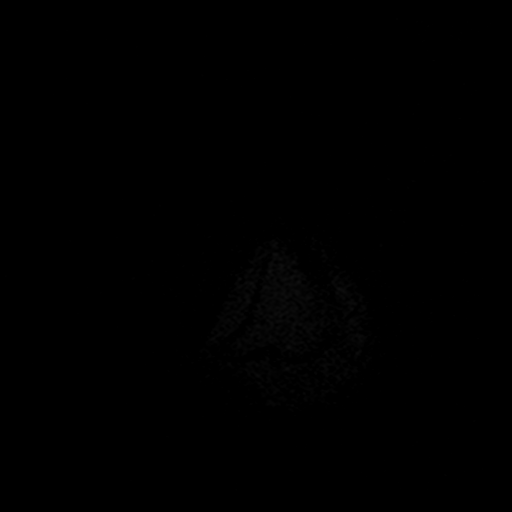

[Series 8: T2 fat-sat · coronal · 3.0mm · 0.31mm/px · 6 of 40 slices shown (3 of 3)]
[im 1/40]
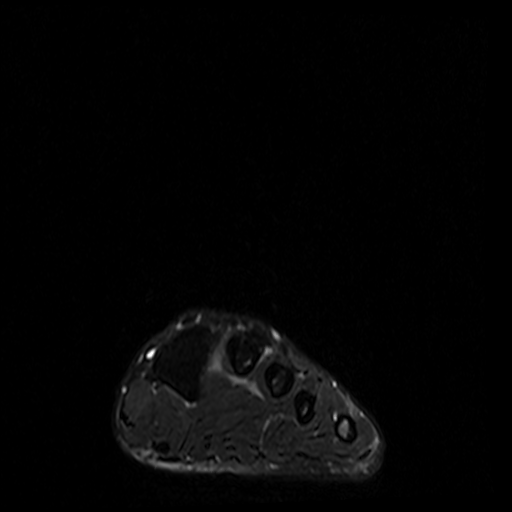
[im 6/40]
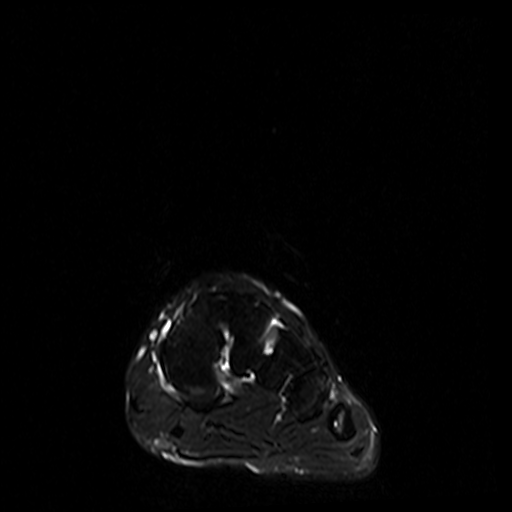
[im 12/40]
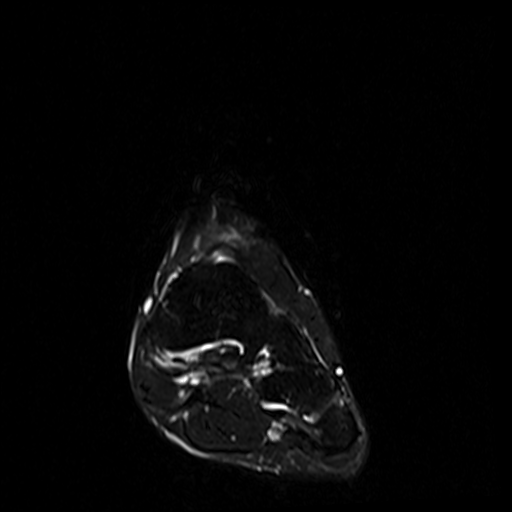
[im 17/40]
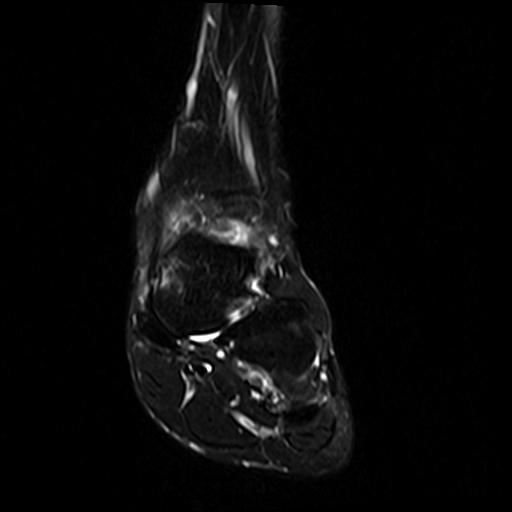
[im 23/40]
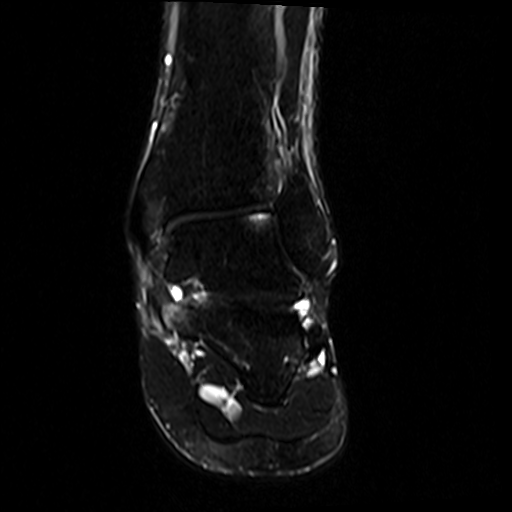
[im 34/40]
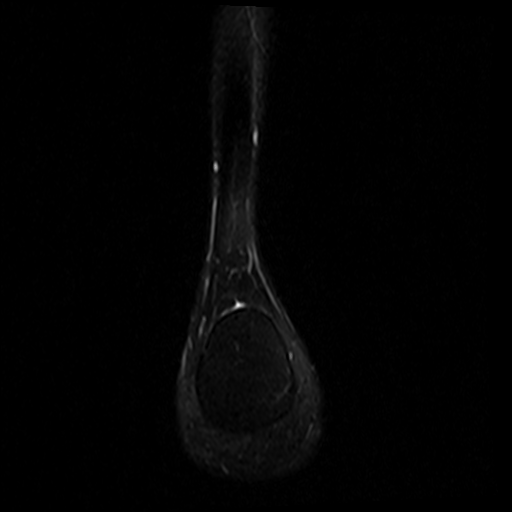

[28 of 40 positions shown; findings below may reference images not displayed]

FINDINGS: TENDONS

Peroneal: There is mild tenosynovitis of the peroneal tendons above
and below the lateral malleolus. There is no evidence of acute
tendon tear.

Posteromedial: Mild tenosynovitis of the posterior tibial tendon and
flexor digitorum tendon above the medial malleolus. Flexor hallucis
longus is intact.

Anterior: Intact tibialis anterior, extensor hallucis longus and
extensor digitorum longus tendons.

Achilles: Intact.

Plantar Fascia: Intact.

LIGAMENTS

Lateral: The anterior talofibular ligament is intact.
Calcaneofibular ligament intact. Posterior talofibular ligament
intact. Anterior and posterior tibiofibular ligaments intact.

Medial: There is loss of striation of the anterior deep deltoid, and
adjacent bony contusions in the medial malleolus and medial talus.
Spring ligament is intact.

CARTILAGE

Ankle Joint: No joint effusion. There is subchondral marrow edema
along the lateral talar dome. The subchondral bone plate is intact.
No overlying cartilage defect. No unstable fragment.

Subtalar Joints/Sinus Tarsi: Normal subtalar joints. No subtalar
joint effusion. Normal sinus tarsi.

Bones: No other marrow signal abnormality. No acute fracture or
dislocation.

Soft Tissue: No fluid collection or hematoma. Muscles are normal
without edema or atrophy. Tarsal tunnel is normal.
IMPRESSION: Partial tear of the anterior deep deltoid ligament, with adjacent
contusions in the medial malleolus and medial talus. Intact lateral
ankle ligaments.

Subchondral marrow edema along the lateral talar dome consistent
with low-grade osteochondral injury. No unstable fragment.

Mild tenosynovitis of the peroneal tendons above and below the
lateral malleolus.

Mild tenosynovitis of the posterior tibial tendon and flexor
digitorum tendon above the medial malleolus.
